# Patient Record
Sex: Female | Born: 1990 | Race: White | Hispanic: No | Marital: Single | Smoking: Never smoker
Health system: Southern US, Community
[De-identification: ages and names within clinical notes are randomized; demographics above are authoritative.]

## PROBLEM LIST (undated history)

## (undated) DIAGNOSIS — G809 Cerebral palsy, unspecified: Secondary | ICD-10-CM

## (undated) DIAGNOSIS — R6250 Unspecified lack of expected normal physiological development in childhood: Secondary | ICD-10-CM

## (undated) DIAGNOSIS — M419 Scoliosis, unspecified: Secondary | ICD-10-CM

## (undated) DIAGNOSIS — R569 Unspecified convulsions: Secondary | ICD-10-CM

## (undated) HISTORY — PX: OTHER SURGICAL HISTORY: SHX169

## (undated) HISTORY — DX: Scoliosis, unspecified: M41.9

## (undated) HISTORY — DX: Cerebral palsy, unspecified: G80.9

## (undated) HISTORY — DX: Unspecified lack of expected normal physiological development in childhood: R62.50

## (undated) HISTORY — DX: Unspecified convulsions: R56.9

## (undated) HISTORY — PX: HIP SURGERY: SHX245

## (undated) HISTORY — PX: GASTROSTOMY TUBE CHANGE: SHX312

---
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
## 2010-05-31 ENCOUNTER — Other Ambulatory Visit: Payer: Self-pay | Admitting: Pediatrics

## 2010-06-08 ENCOUNTER — Ambulatory Visit: Payer: Self-pay | Admitting: Pediatrics

## 2010-10-31 ENCOUNTER — Encounter: Payer: Self-pay | Admitting: Pediatrics

## 2011-08-19 ENCOUNTER — Encounter: Payer: Self-pay | Admitting: Pediatrics

## 2012-01-02 IMAGING — CR RIGHT HIP - COMPLETE 2+ VIEW
1 series · 2 of 2 positions shown · non-contrast
Comparison: none

REASON FOR EXAM: rt hip and leg pain
COMMENTS:

PROCEDURE:     DXR - DXR HIP RIGHT COMPLETE  - June 08, 2010 [DATE]
RESULT:     Comparison: None.

[Series 1: view not recorded · 0.17mm/px · 2 of 2 slices shown]
[im 1/2]
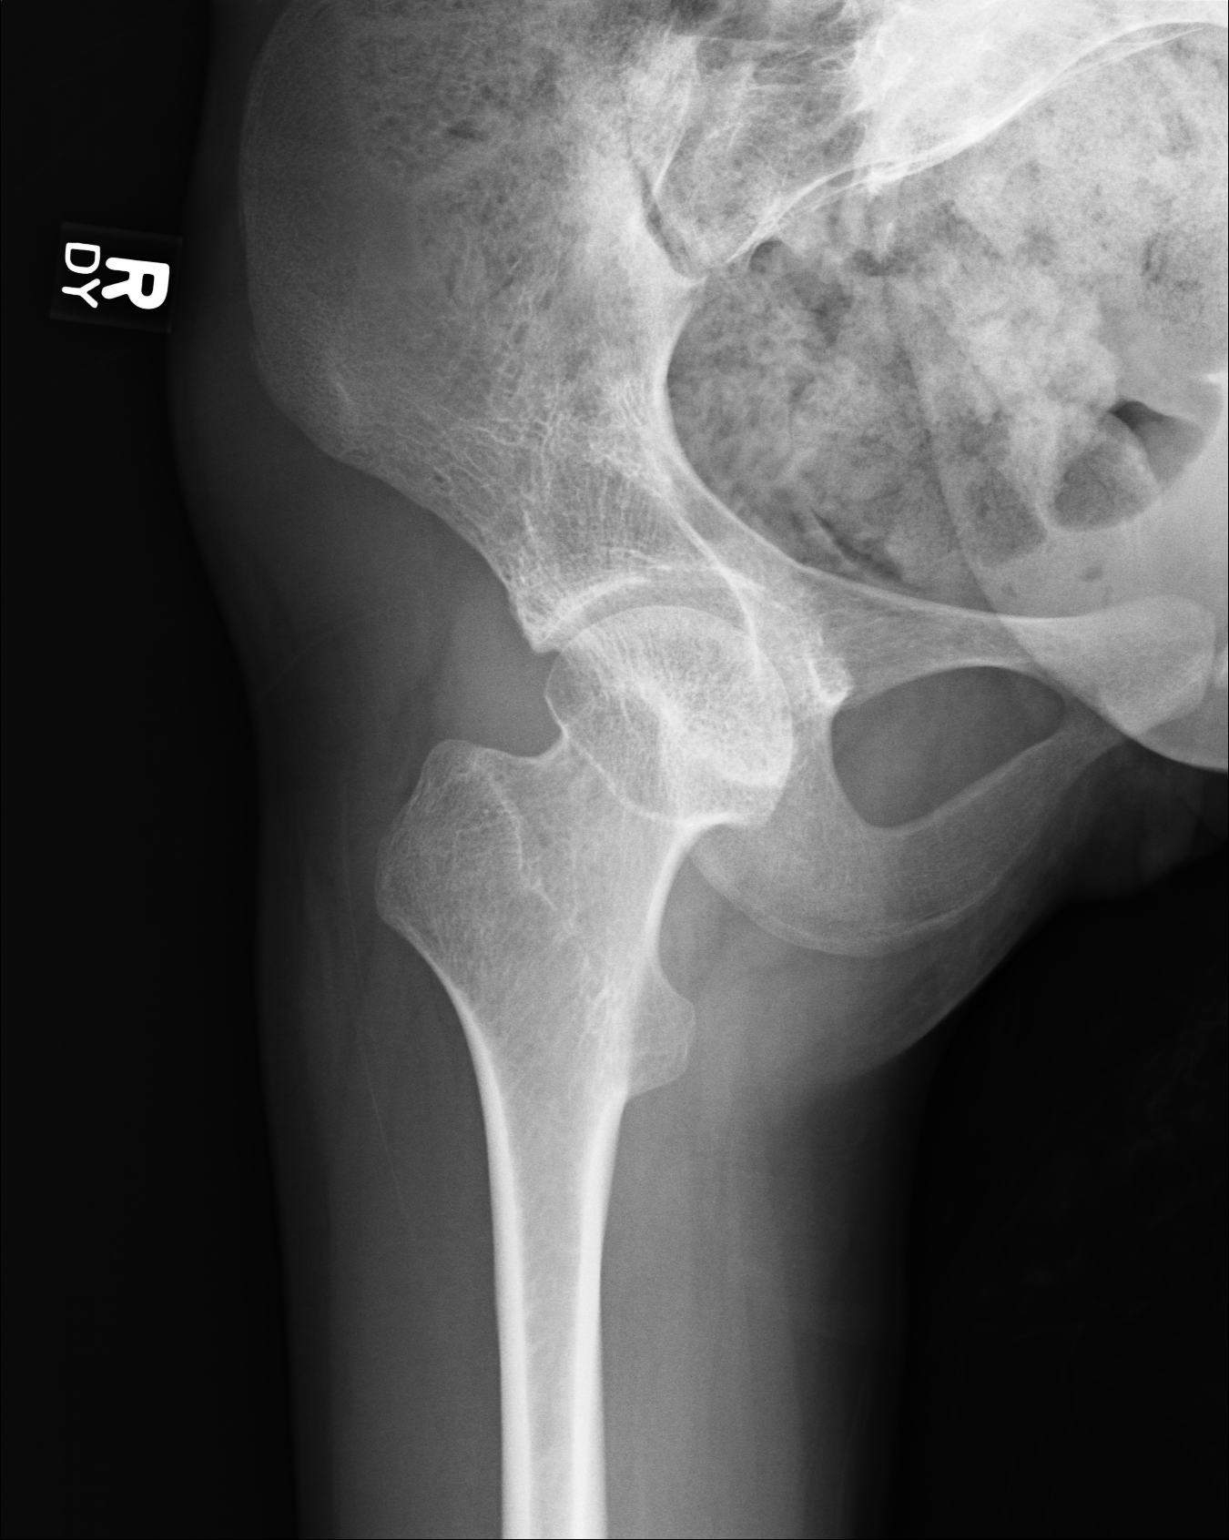
[im 2/2]
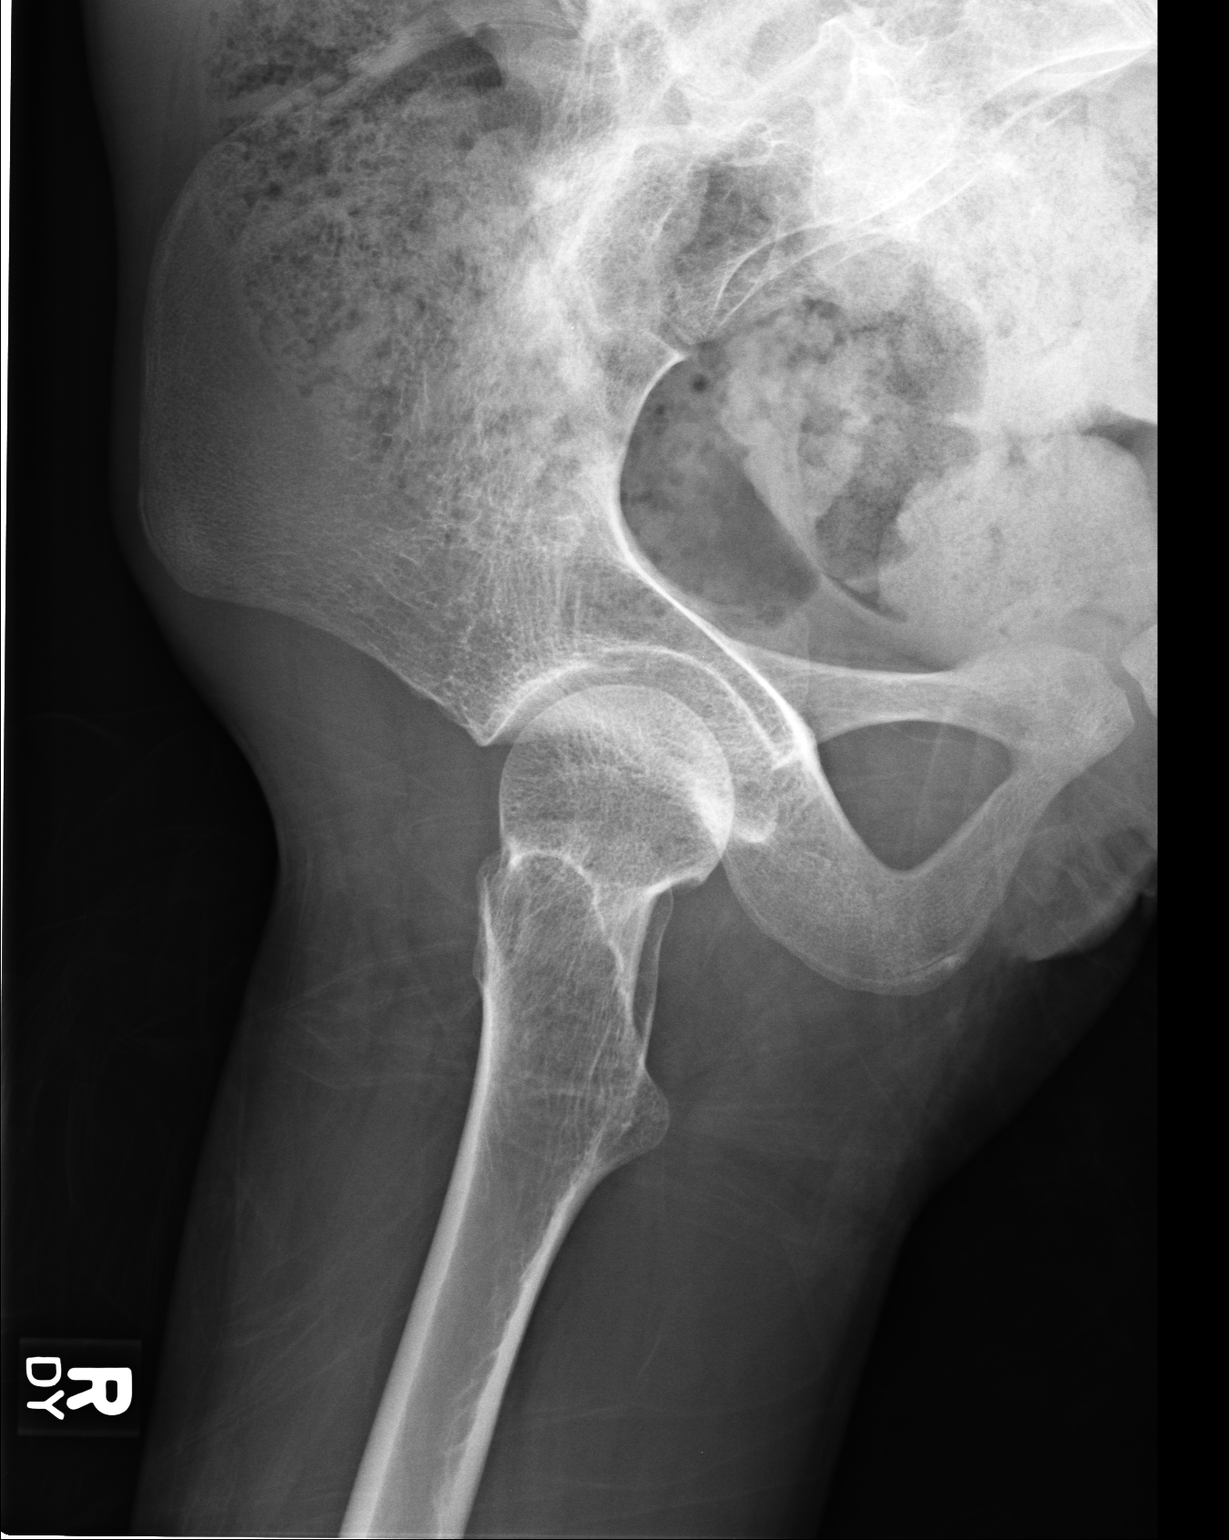

[2 of 2 positions shown; findings below may reference images not displayed]

FINDINGS: Evaluation is limited by patient positioning. The pelvis is tilted. No acute
fracture seen. There is unusual appearance of the right femoral head which
raises the possibility of some degree of congenital dysplasia of the femoral
head. The femoral diaphysis is relatively narrow, likely on a congenital
basis. If there is continued clinical concern, consider repeat radiograph
with improved attention to positioning and comparison with the left hip.
IMPRESSION: Please see above.

## 2012-10-15 ENCOUNTER — Other Ambulatory Visit: Payer: Self-pay | Admitting: Pediatrics

## 2012-10-15 LAB — COMPREHENSIVE METABOLIC PANEL
Albumin: 4.1 g/dL (ref 3.4–5.0)
Alkaline Phosphatase: 83 U/L (ref 50–136)
Anion Gap: 3 — ABNORMAL LOW (ref 7–16)
Bilirubin,Total: 0.2 mg/dL (ref 0.2–1.0)
Calcium, Total: 9.1 mg/dL (ref 8.5–10.1)
Co2: 30 mmol/L (ref 21–32)
EGFR (African American): >60
Osmolality: 279 (ref 275–301)
Potassium: 4.1 mmol/L (ref 3.5–5.1)
SGPT (ALT): 19 U/L (ref 12–78)
Sodium: 140 mmol/L (ref 136–145)

## 2012-10-15 LAB — CBC WITH DIFFERENTIAL/PLATELET
Basophil #: 0 10*3/uL (ref 0.0–0.1)
Eosinophil #: 0.1 10*3/uL (ref 0.0–0.7)
Eosinophil %: 1.8 %
HCT: 45.3 % (ref 35.0–47.0)
HGB: 15.5 g/dL (ref 12.0–16.0)
Lymphocyte %: 47.5 %
MCH: 31.5 pg (ref 26.0–34.0)
MCHC: 34.2 g/dL (ref 32.0–36.0)
Neutrophil #: 2 10*3/uL (ref 1.4–6.5)
Neutrophil %: 43 %
Platelet: 209 10*3/uL (ref 150–440)
RBC: 4.91 10*6/uL (ref 3.80–5.20)
RDW: 13.1 % (ref 11.5–14.5)
WBC: 4.7 10*3/uL (ref 3.6–11.0)

## 2015-07-12 ENCOUNTER — Other Ambulatory Visit
Admission: RE | Admit: 2015-07-12 | Discharge: 2015-07-12 | Disposition: A | Discharge status: Home / Self Care | Payer: Medicaid Other | Source: Ambulatory Visit | Attending: Pediatrics | Admitting: Pediatrics

## 2015-07-12 DIAGNOSIS — G809 Cerebral palsy, unspecified: Secondary | ICD-10-CM | POA: Insufficient documentation

## 2015-07-12 LAB — CBC WITH DIFFERENTIAL/PLATELET
BASOS ABS: 0 10*3/uL (ref 0–0.1)
Basophils Relative: 1 %
EOS ABS: 0.1 10*3/uL (ref 0–0.7)
Eosinophils Relative: 3 %
HCT: 45.2 % (ref 35.0–47.0)
Hemoglobin: 15.3 g/dL (ref 12.0–16.0)
LYMPHS ABS: 1.2 10*3/uL (ref 1.0–3.6)
Lymphocytes Relative: 28 %
MCH: 30.2 pg (ref 26.0–34.0)
MCHC: 33.8 g/dL (ref 32.0–36.0)
MCV: 89.4 fL (ref 80.0–100.0)
Monocytes Absolute: 0.3 10*3/uL (ref 0.2–0.9)
Monocytes Relative: 8 %
NEUTROS PCT: 60 %
Neutro Abs: 2.6 10*3/uL (ref 1.4–6.5)
PLATELETS: 273 10*3/uL (ref 150–440)
RBC: 5.06 MIL/uL (ref 3.80–5.20)
RDW: 13.5 % (ref 11.5–14.5)
WBC: 4.2 10*3/uL (ref 3.6–11.0)

## 2015-07-12 LAB — CARBAMAZEPINE LEVEL, TOTAL: CARBAMAZEPINE LVL: 7.1 ug/mL (ref 4.0–12.0)

## 2016-01-11 ENCOUNTER — Ambulatory Visit: Payer: Self-pay | Admitting: Family Medicine

## 2016-09-05 ENCOUNTER — Encounter: Payer: Self-pay | Admitting: Family Medicine

## 2016-09-05 ENCOUNTER — Ambulatory Visit (INDEPENDENT_AMBULATORY_CARE_PROVIDER_SITE_OTHER): Payer: Medicaid Other | Admitting: Family Medicine

## 2016-09-05 VITALS — BP 139/99 | HR 87 | Temp 98.4°F

## 2016-09-05 DIAGNOSIS — Z931 Gastrostomy status: Secondary | ICD-10-CM

## 2016-09-05 DIAGNOSIS — Z7689 Persons encountering health services in other specified circumstances: Secondary | ICD-10-CM

## 2016-09-05 DIAGNOSIS — G809 Cerebral palsy, unspecified: Secondary | ICD-10-CM | POA: Insufficient documentation

## 2016-09-05 DIAGNOSIS — G40909 Epilepsy, unspecified, not intractable, without status epilepticus: Secondary | ICD-10-CM | POA: Diagnosis not present

## 2016-09-05 NOTE — Patient Instructions (Signed)
Follow up in 3 months for Physical Exam

## 2016-09-05 NOTE — Progress Notes (Signed)
   BP (!) 139/99   Pulse 87   Temp 98.4 F (36.9 C)   LMP 08/11/2016 (Approximate)    Subjective:    Patient ID: Charlene Gomez, female    DOB: 1990/06/12, 26 y.o.   MRN: [id number]  HPI: Charlene BeltsKristin N Gomez is a 26 y.o. female  Chief Complaint  Patient presents with  . Establish Care    no concerns, she had been going to [facility].    Patient presents today with mother and grandmother who provide all of the hx. 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 Has been followed long term by [facility] and has aged out. Has a G tube for nutrition, has been followed by GI for this and needs a referral for a new one. Taking valium and carbatrol for seizure disorder, per family this has been stable on these medications. Has not seen Neurology for years. Also has a complicated orthopedic hx with multiple joint replacements, family wanting referral to discuss further intervention.   No other concerns at this time.   Relevant past medical, surgical, family and social history reviewed and updated as indicated. Interim medical history since our last visit reviewed. Allergies and medications reviewed and updated.  Review of Systems  Unable to perform ROS: Patient nonverbal   Per HPI unless specifically indicated above     Objective:    BP (!) 139/99   Pulse 87   Temp 98.4 F (36.9 C)   LMP 08/11/2016 (Approximate)   Wt Readings from Last 3 Encounters:  No data found for Wt    Physical Exam  Pulmonary/Chest: Effort normal. No respiratory distress.  Musculoskeletal:  Wheelchair bound, obvious skeletal abnormalities present from CP  Skin: Skin is warm and dry.  Psychiatric:  Pt non-verbal d/t CP  Nursing note and vitals reviewed.     Assessment & Plan:   Problem List Items Addressed This Visit      Nervous and Auditory   CP (cerebral palsy) (HCC)    Will hold off on referral to orthopedics until family decides where they are wanting the referral to be  sent. They will call back ASAP with this information.       Seizure disorder [facility](HCC)    Discussed with patient's family that I do not manage seizure medications and that I'm happy to refer them to a specialist. They prefer to have all medications handled with PCP. They will let us know what they decide to do here.         Other   Gastrostomy tube dependent (HCC)    Will refer to GI for management once family decides where they want her to go. They will call back with this information ASAP       Other Visit Diagnoses    Encounter to establish care    -  Primary       Follow up plan: Return in about 3 months (around 12/06/2016) for CPE.

## 2016-09-11 DIAGNOSIS — Z931 Gastrostomy status: Secondary | ICD-10-CM | POA: Insufficient documentation

## 2016-09-11 DIAGNOSIS — R569 Unspecified convulsions: Secondary | ICD-10-CM | POA: Insufficient documentation

## 2016-09-11 NOTE — Assessment & Plan Note (Signed)
Will refer to GI for management once family decides where they want her to go. They will call back with this information ASAP

## 2016-09-11 NOTE — Assessment & Plan Note (Signed)
Discussed with patient's family that I do not manage seizure medications and that I'm happy to refer them to a specialist. They prefer to have all medications handled with PCP. They will let us know what they decide to do here.

## 2016-09-11 NOTE — Assessment & Plan Note (Signed)
Will hold off on referral to orthopedics until family decides where they are wanting the referral to be sent. They will call back ASAP with this information.

## 2016-10-28 ENCOUNTER — Other Ambulatory Visit: Payer: Self-pay | Admitting: Family Medicine

## 2016-10-28 ENCOUNTER — Telehealth: Payer: Self-pay | Admitting: Family Medicine

## 2016-10-28 NOTE — Telephone Encounter (Signed)
Spoke with pharmacy, medication is ready to be picked up.

## 2016-10-28 NOTE — Telephone Encounter (Signed)
Patient's mother notified that medication is ready for pick up.

## 2016-10-28 NOTE — Telephone Encounter (Signed)
Patients mom called to see if someone had called in refills for the patients medication.  She has been out of her seizure meds since yesterday and had a seizure earlier today.    [facility]   [place]rie [place] [phone]  Thanks

## 2016-12-09 ENCOUNTER — Encounter: Payer: Medicaid Other | Admitting: Family Medicine

## 2016-12-23 ENCOUNTER — Encounter: Payer: Self-pay | Admitting: Family Medicine

## 2016-12-23 ENCOUNTER — Ambulatory Visit (INDEPENDENT_AMBULATORY_CARE_PROVIDER_SITE_OTHER): Payer: Medicaid Other | Admitting: Family Medicine

## 2016-12-23 VITALS — BP 131/86 | HR 85 | Temp 99.5°F

## 2016-12-23 DIAGNOSIS — B372 Candidiasis of skin and nail: Secondary | ICD-10-CM | POA: Diagnosis not present

## 2016-12-23 DIAGNOSIS — Z0000 Encounter for general adult medical examination without abnormal findings: Secondary | ICD-10-CM | POA: Diagnosis not present

## 2016-12-23 DIAGNOSIS — G40909 Epilepsy, unspecified, not intractable, without status epilepticus: Secondary | ICD-10-CM

## 2016-12-23 DIAGNOSIS — Z931 Gastrostomy status: Secondary | ICD-10-CM | POA: Diagnosis not present

## 2016-12-23 DIAGNOSIS — G809 Cerebral palsy, unspecified: Secondary | ICD-10-CM | POA: Diagnosis not present

## 2016-12-23 MED ORDER — NYSTATIN 100000 UNIT/GM EX CREA: 1.0000 "application " | TOPICAL_CREAM | Freq: Two times a day (BID) | CUTANEOUS | 2 refills | Status: DC

## 2016-12-23 NOTE — Progress Notes (Signed)
BP 131/86   Pulse 85   Temp 99.5 F (37.5 C)    Subjective:    Patient ID: Charlene Gomez, female    DOB: 01/17/1991, 26 y.o.   MRN: [id number]  HPI: Charlene Gomez is a 26 y.o. female presenting on 12/23/2016 for comprehensive medical examination. Current medical complaints include:new rash noted this morning in skin fold under ribcage  Also has not established with Neurology for seizures or CP, and does not have GI specialist to manage her G tube.   Depression Screen done today and results listed below:  No flowsheet data found.  The patient does not have a history of falls. I did not complete a risk assessment for falls. A plan of care for falls was not documented. *Patient is wheelchair bound*   Past Medical History:  Past Medical History:  Diagnosis Date  . CP (cerebral palsy) (HCC)   . Developmental delay   . Premature birth   . Scoliosis   . Seizures [facility])     Surgical History:  Past Surgical History:  Procedure Laterality Date  . GASTROSTOMY TUBE CHANGE    . heart murmur repair    . HIP SURGERY Left    2 surgeries, metal rod  . reflux surgery      Medications:  Current Outpatient Prescriptions on File Prior to Visit  Medication Sig  . carbamazepine (CARBATROL) 200 MG 12 hr capsule Take 200 mg by mouth 2 (two) times daily.  . diazepam (DIASTAT) 2.5 MG GEL Place 2.5 mg rectally once.   . diazepam (VALIUM) 5 MG tablet Take 5 mg by mouth every 6 (six) hours as needed for anxiety.  . ranitidine (ZANTAC) 75 MG/5ML syrup TAKE 5 ML BY MOUTH TWICE A DAY FOR 30 DAYS (5 ML = 75 MG)   No current facility-administered medications on file prior to visit.     Allergies:  Allergies  Allergen Reactions  . Cefprozil Other (See Comments)    Social History:  Social History   Social History  . Marital status: Single    Spouse name: N/A  . Number of children: N/A  . Years of education: N/A   Occupational History  . Not on file.   Social History Main  Topics  . Smoking status: Never Smoker  . Smokeless tobacco: Never Used  . Alcohol use No  . Drug use: No  . Sexual activity: Not on file   Other Topics Concern  . Not on file   Social History Narrative  . No narrative on file   History  Smoking Status  . Never Smoker  Smokeless Tobacco  . Never Used   History  Alcohol Use No    Family History:  Family History  Problem Relation Age of Onset  . COPD Mother   . Diabetes Maternal Grandmother   . Heart disease Maternal Grandmother   . Cancer Neg Hx   . Hypertension Neg Hx   . Stroke Neg Hx     Past medical history, surgical history, medications, allergies, family history and social history reviewed with patient today and changes made to appropriate areas of the chart.   Review of Systems - unable to perform ROS as pt is non-verbal, but primary caregiver states no concerns other than a new rash All other ROS negative except what is listed above and in the HPI.      Objective:    BP 131/86   Pulse 85   Temp 99.5 F (37.5 C)  Wt Readings from Last 3 Encounters:  No data found for Wt    Physical Exam  Results for orders placed or performed during the hospital encounter of 07/12/15  CBC with Differential/Platelet  Result Value Ref Range   WBC 4.2 3.6 - 11.0 K/uL   RBC 5.06 3.80 - 5.20 MIL/uL   Hemoglobin 15.3 12.0 - 16.0 g/dL   HCT 93.2 35.5 - 73.2 %   MCV 89.4 80.0 - 100.0 fL   MCH 30.2 26.0 - 34.0 pg   MCHC 33.8 32.0 - 36.0 g/dL   RDW 20.2 54.2 - 70.6 %   Platelets 273 150 - 440 K/uL   Neutrophils Relative % 60 %   Neutro Abs 2.6 1.4 - 6.5 K/uL   Lymphocytes Relative 28 %   Lymphs Abs 1.2 1.0 - 3.6 K/uL   Monocytes Relative 8 %   Monocytes Absolute 0.3 0.2 - 0.9 K/uL   Eosinophils Relative 3 %   Eosinophils Absolute 0.1 0 - 0.7 K/uL   Basophils Relative 1 %   Basophils Absolute 0.0 0 - 0.1 K/uL  Carbamazepine level, total  Result Value Ref Range   Carbamazepine Lvl 7.1 4.0 - 12.0 ug/mL        Assessment & Plan:   Problem List Items Addressed This Visit      Nervous and Auditory   CP (cerebral palsy) (HCC)    Wheelchair bound. Referral placed to multiple specialists for management      Seizure disorder [facility])    Referral placed to Neurology for management. Continue current regimen.       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 Recommended powders, regular cleaning   Relevant Medications   nystatin cream (MYCOSTATIN)       Follow up plan: Return in about 1 year (around 12/23/2017) for CPE.   LABORATORY TESTING:  - Pap smear: declined  IMMUNIZATIONS:   - Tdap: Tetanus vaccination status reviewed: postponed. - Influenza: Postponed to flu season  PATIENT COUNSELING:   Advised to take 1 mg of folate supplement per day if capable of pregnancy.   Sexuality: Discussed sexually transmitted diseases, partner selection, use of condoms, avoidance of unintended pregnancy  and contraceptive alternatives.   Advised to avoid cigarette smoking.  I discussed with the patient that most people either abstain from alcohol or drink within safe limits (<=14/week and <=4 drinks/occasion for males, <=7/weeks and <= 3 drinks/occasion for females) and that the risk for alcohol disorders and other health effects rises proportionally with the number of drinks per week and how often a drinker exceeds daily limits.  Discussed cessation/primary prevention of drug use and availability of treatment for abuse.   Diet: Encouraged to adjust caloric intake to maintain  or achieve ideal  body weight, to reduce intake of dietary saturated fat and total fat, to limit sodium intake by avoiding high sodium foods and not adding table salt, and to maintain adequate dietary potassium and calcium preferably from fresh fruits, vegetables,  and low-fat dairy products.    stressed the importance of regular exercise  Injury prevention: Discussed safety Gomez, safety helmets, smoke detector, smoking near bedding or upholstery.   Dental health: Discussed importance of regular tooth brushing, flossing, and dental visits.    NEXT PREVENTATIVE PHYSICAL DUE IN 1 YEAR. Return in about 1 year (around 12/23/2017) for CPE.

## 2016-12-25 NOTE — Assessment & Plan Note (Signed)
Wheelchair bound. Referral placed to multiple specialists for management

## 2016-12-25 NOTE — Assessment & Plan Note (Signed)
Referral placed to Neurology for management. Continue current regimen.

## 2016-12-25 NOTE — Patient Instructions (Signed)
Follow up in 1 year.

## 2016-12-25 NOTE — Assessment & Plan Note (Signed)
Referral to GI placed for management

## 2016-12-31 ENCOUNTER — Telehealth: Payer: Self-pay | Admitting: Family Medicine

## 2016-12-31 ENCOUNTER — Ambulatory Visit: Payer: Self-pay | Admitting: Gastroenterology

## 2016-12-31 NOTE — Telephone Encounter (Signed)
Patient's grandmother called to get neurology referral information. Patient's grandmother called to cancel her 10 am appointment. Patient's grandmother and patient would like to be refereed to Neurology such as [facility] [place]. Patient's grandmother would like to speak with someone about change in location for referral.  Patient's grandmother listed in [place].  Please Advise.  Thank you

## 2017-01-02 NOTE — Telephone Encounter (Signed)
See Referral, it is being changed.

## 2017-01-28 ENCOUNTER — Encounter: Payer: Self-pay | Admitting: Gastroenterology

## 2017-01-28 ENCOUNTER — Ambulatory Visit (INDEPENDENT_AMBULATORY_CARE_PROVIDER_SITE_OTHER): Payer: Medicaid Other | Admitting: Gastroenterology

## 2017-01-28 ENCOUNTER — Encounter (INDEPENDENT_AMBULATORY_CARE_PROVIDER_SITE_OTHER): Payer: Self-pay

## 2017-01-28 VITALS — HR 77

## 2017-01-28 DIAGNOSIS — L089 Local infection of the skin and subcutaneous tissue, unspecified: Secondary | ICD-10-CM | POA: Diagnosis not present

## 2017-01-28 DIAGNOSIS — Z931 Gastrostomy status: Secondary | ICD-10-CM

## 2017-01-28 DIAGNOSIS — K9422 Gastrostomy infection: Secondary | ICD-10-CM | POA: Diagnosis not present

## 2017-01-28 MED ORDER — MUPIROCIN CALCIUM 2 % EX CREA: 1.0000 "application " | TOPICAL_CREAM | Freq: Two times a day (BID) | CUTANEOUS | 0 refills | Status: AC

## 2017-01-28 NOTE — Progress Notes (Signed)
Wyline Mood MD, MRCP(U.K) [address]  [address]  [place], [place] [address]  Main: [phone]  Fax: [phone]   Gastroenterology Consultation  Referring Provider:     Particia Nearing,* Primary Care Physician:  Particia Nearing, [place] Primary Gastroenterologist:  Dr. Wyline Mood  Reason for Consultation:     G tube management         HPI:   Charlene Gomez is a 26 y.o. y/o female referred for consultation & management  by Dr. Maurice March, Salley Hews, PA-C.    She has been referred to see me for management of the G tube. Had a G tube since 26 years. Last changed 3 months back and they usually change it every 3 months. She wanted to establish care with a GI doctor. At present only issue is mild erythema at the PEG site.  Past Medical History:  Diagnosis Date  . CP (cerebral palsy) (HCC)   . Developmental delay   . Premature birth   . Scoliosis   . Seizures (HCC)     Past Surgical History:  Procedure Laterality Date  . GASTROSTOMY TUBE CHANGE    . heart murmur repair    . HIP SURGERY Left    2 surgeries, metal rod  . reflux surgery      Prior to Admission medications   Medication Sig Start Date End Date Taking? Authorizing Provider  carbamazepine (CARBATROL) 200 MG 12 hr capsule Take 200 mg by mouth 2 (two) times daily. 08/28/16   [provider]  diazepam (DIASTAT) 2.5 MG GEL Place 2.5 mg rectally once.     [provider]  diazepam (VALIUM) 5 MG tablet Take 5 mg by mouth every 6 (six) hours as needed for anxiety.    [provider]  nystatin cream (MYCOSTATIN) Apply 1 application topically 2 (two) times daily. 12/23/16   Particia Nearing, PA-C  ranitidine (ZANTAC) 75 MG/5ML syrup TAKE 5 ML BY MOUTH TWICE A DAY FOR 30 DAYS (5 ML = 75 MG) 07/29/16   [provider]    Family History  Problem Relation Age of Onset  . COPD Mother   . Diabetes Maternal Grandmother   . Heart disease Maternal Grandmother   .  Cancer Neg Hx   . Hypertension Neg Hx   . Stroke Neg Hx      Social History  Substance Use Topics  . Smoking status: Never Smoker  . Smokeless tobacco: Never Used  . Alcohol use No    Allergies as of 01/28/2017 - Review Complete 12/23/2016  Allergen Reaction Noted  . Cefprozil Other (See Comments)     Review of Systems:    All systems reviewed and negative except where noted in HPI.   Physical Exam:  There were no vitals taken for this visit. No LMP recorded. Psych:  Alert unable to communicate verbally . General:   Alert,  In wheel chair, not in distress  Head:  Normocephalic and atraumatic. Eyes:  Sclera clear, no icterus.   Conjunctiva pink. Ears:  Normal auditory acuity. Nose:  No deformity, discharge, or lesions. Mouth:  Poor dentition  Neck:  Supple; no masses or thyromegaly. Lungs:  Respirations even and unlabored.  Clear throughout to auscultation.   No wheezes, crackles, or rhonchi. No acute distress. Heart:  Regular rate and rhythm; no murmurs, clicks, rubs, or gallops. Abdomen:  Peg site appears mildly erthematous, PEG tube freely mobile , no fluctuation or tenderness Msk:  Symmetrical without gross  deformities. Good, equal movement & strength bilaterally. Pulses:  Normal pulses noted.  Imaging Studies: No results found.  Assessment and Plan:   Charlene Gomez is a 26 y.o. y/o female has been referred for G tube management . The external gastrostomy tube bolster should be positioned such that 1 to 2 cm of in and out movement can be achieved. Loose apposition of the bolster to the abdominal wall does not result in peritoneal leakage .If the tissue between the internal and external bolsters is compressed, it may lead to pressure necrosis and breakdown of the gastrostomy site.Flushes of 20 mL of water should be performed after the administration of medications or tube feeds to decrease the risk of clogging.When cleaning the site, the gastrostomy tube should be  inspected for signs of tube deterioration. If these develop, a replacement tube will be needed.Gauze pads should be placed over the external bolster, not underneath, which would create pressure on the gastrostomy tube tract.  Today I will provide her a script for topical mupirocin for the wound site to treat any local infection.   A print out of this office visit will be provided to the patient .   Follow up as needed   Dr Wyline Mood MD,MRCP(U.K)

## 2017-03-30 ENCOUNTER — Telehealth: Payer: Self-pay | Admitting: Family Medicine

## 2017-03-30 NOTE — Telephone Encounter (Signed)
I did receive the paperwork - I am unclear if there is anything necessary for me to complete as it states the form is "for review" and the PT working with her had already filled it out. As far as concerns, she is mostly cared for by her specialists and is very new to me. I do not have any particular concerns at this time but it may be good for them to reach out to her specialists for further details.   Copied from CRM [id number]. Topic: Inquiry >> Mar 25, 2017  4:08 PM Landry MellowFoltz, Melissa J wrote: Reason for CRM: Charlene MuldersBrianna from [place] [facility] called to speak with md or cma to find out if they have any concerns about pt. She also wanted to confirm fax that was sent over on  03/11/17 was received.  Cb number is [phone]

## 2017-03-30 NOTE — Telephone Encounter (Signed)
Left message for Charlene MuldersBrianna to return call.

## 2017-04-01 NOTE — Telephone Encounter (Signed)
Spoke with [name], message was relayed. Denied questions.

## 2017-05-07 ENCOUNTER — Telehealth: Payer: Self-pay

## 2017-05-07 DIAGNOSIS — G40909 Epilepsy, unspecified, not intractable, without status epilepticus: Secondary | ICD-10-CM

## 2017-05-07 NOTE — Telephone Encounter (Signed)
Copied from CRM [phone]. Topic: Referral - Question >> May 07, 2017 10:58 AM Diana EvesHoyt, Maryann B wrote: Reason for CRM: pts mom calling in regards to a referral that was placed on 12/23/16. Pt's mom has contacted Dr. Geralyn FlashPotters office and they are stating they dont have a referral for the pt. Please contact mom and let her know what she needs to do from here. Mom's cell is [phone]

## 2017-05-07 NOTE — Telephone Encounter (Signed)
Patient No Showed to her appointment with Dr. Malvin JohnsPotter. The referral may not be in the system because they will only hold on to it for 3-6 months. Mother notified.   Mother requested urgent referral to Dr. Malvin JohnsPotter.  Explained to mother if patient's seizures have worsened or loss of consciences to go the ER. Mother understood.

## 2017-05-07 NOTE — Telephone Encounter (Signed)
New referral placed. Please give Mother phone number and she can call to make the appointment to move things along. Thanks!

## 2017-06-08 ENCOUNTER — Telehealth: Payer: Self-pay | Admitting: Family Medicine

## 2017-06-08 DIAGNOSIS — M6249 Contracture of muscle, multiple sites: Secondary | ICD-10-CM | POA: Insufficient documentation

## 2017-06-08 NOTE — Telephone Encounter (Signed)
I've never run a carbamazepine level on her because I sent her directly to Neurology for management after establishing care. We have some old levels from her Pediatrician as recently as 2017 but nothing since

## 2017-06-08 NOTE — Telephone Encounter (Signed)
Routing to provider  

## 2017-06-08 NOTE — Telephone Encounter (Signed)
Copied from CRM [phone]. Topic: Inquiry >> Jun 08, 2017  1:20 PM [name], [name], [name] wrote: Reason for CRM: Cori [facility] called because they need copies of the patients carbamazepine labs. If someone could give her a call back about this at [phone] and fax number is [phone]

## 2017-06-08 NOTE — Telephone Encounter (Signed)
Tried calling and it stated office is closed. Will try again tomorrow.

## 2017-06-09 NOTE — Telephone Encounter (Signed)
[facility] notified. They said they will send the information back to the nurse.

## 2018-01-06 DIAGNOSIS — M4146 Neuromuscular scoliosis, lumbar region: Secondary | ICD-10-CM | POA: Insufficient documentation

## 2018-04-14 ENCOUNTER — Other Ambulatory Visit: Payer: Self-pay | Admitting: Family Medicine

## 2018-04-14 NOTE — Telephone Encounter (Signed)
Pt's mother called to see if there was any way for Rx to be filled as soon as possible. Pt has a raw area where the cream goes and needs it as soon as possible. Please advise CB# [phone]

## 2018-04-14 NOTE — Telephone Encounter (Signed)
Requested medication (s) are due for refill today: Yes  Requested medication (s) are on the active medication list: Yes  Last refill:  12/23/16  Future visit scheduled: Yes  Notes to clinic:  Unable to refill per protocol.     Requested Prescriptions  Pending Prescriptions Disp Refills   nystatin cream (MYCOSTATIN) [Pharmacy Med Name: NYSTATIN 100,000 UNIT/GM CREAM] 30 g 2    Sig: APPLY TO AFFECTED AREA TWICE A DAY     Off-Protocol Failed - 04/14/2018  3:48 PM      Failed - Medication not assigned to a protocol, review manually.      Failed - Valid encounter within last 12 months    Recent Outpatient Visits          1 year ago Annual physical exam   [facility] Particia NearingLane, Rachel Elizabeth, [place]   1 year ago Encounter to establish care   [facility] Lane, Salley Hewsachel Elizabeth, [place]      Future Appointments            In 3 weeks Maurice MarchLane, Salley Hewsachel Elizabeth, PA-C [facility]

## 2018-05-11 ENCOUNTER — Encounter: Payer: Self-pay | Admitting: Family Medicine

## 2018-06-09 ENCOUNTER — Encounter: Payer: Self-pay | Admitting: Family Medicine

## 2018-06-09 ENCOUNTER — Ambulatory Visit (INDEPENDENT_AMBULATORY_CARE_PROVIDER_SITE_OTHER): Payer: Medicaid Other | Admitting: Family Medicine

## 2018-06-09 VITALS — HR 90

## 2018-06-09 DIAGNOSIS — R569 Unspecified convulsions: Secondary | ICD-10-CM

## 2018-06-09 DIAGNOSIS — R21 Rash and other nonspecific skin eruption: Secondary | ICD-10-CM | POA: Diagnosis not present

## 2018-06-09 DIAGNOSIS — Z0000 Encounter for general adult medical examination without abnormal findings: Secondary | ICD-10-CM | POA: Diagnosis not present

## 2018-06-09 DIAGNOSIS — Z931 Gastrostomy status: Secondary | ICD-10-CM

## 2018-06-09 DIAGNOSIS — G809 Cerebral palsy, unspecified: Secondary | ICD-10-CM

## 2018-06-09 MED ORDER — MUPIROCIN 2 % EX OINT: 1.0000 "application " | TOPICAL_OINTMENT | Freq: Two times a day (BID) | CUTANEOUS | 0 refills | Status: DC

## 2018-06-09 MED ORDER — RANITIDINE HCL 75 MG/5ML PO SYRP: 75.0000 mg | ORAL_SOLUTION | Freq: Two times a day (BID) | ORAL | 3 refills | Status: DC

## 2018-06-09 NOTE — Patient Instructions (Signed)
Wyline Mood MD, MRCP(U.K) [address]  [address]  [place], [place] [address]  Main: [phone]  Fax: [phone]

## 2018-06-09 NOTE — Progress Notes (Signed)
Pulse 90   SpO2 96%    Subjective:    Patient ID: Charlene Gomez, female    DOB: 29-Aug-1990, 28 y.o.   MRN: [id number]  HPI: Charlene Gomez is a 28 y.o. female presenting on 06/09/2018 for comprehensive medical examination. Current medical complaints include:here with mother and grandmother today. see below for concerns  Mother states she's still having redness and irritation chronically around g tube. Using nystatin regularly. Denies thick drainage, fevers, chills, sweats. Tube managed by GI, but per record review they have not followed up in over a year. Takes zantac daily.   CP monitored by Ortho and Neuro. Neuro also manages her seizure disorder, which she is taking carbamazepine for. Wheelchair bound, has home health services in place.   No new concerns.   She currently lives with: Menopausal Symptoms: no  Depression Screen done today and results listed below:  No flowsheet data found.  The patient does not have a history of falls. I did not complete a risk assessment for falls. A plan of care for falls was not documented.   Past Medical History:  Past Medical History:  Diagnosis Date  . CP (cerebral palsy) (HCC)   . Developmental delay   . Premature birth   . Scoliosis   . Seizures [facility])     Surgical History:  Past Surgical History:  Procedure Laterality Date  . GASTROSTOMY TUBE CHANGE    . heart murmur repair    . HIP SURGERY Left    2 surgeries, metal rod  . reflux surgery      Medications:  Current Outpatient Medications on File Prior to Visit  Medication Sig  . carbamazepine (CARBATROL) 200 MG 12 hr capsule Take 200 mg by mouth 2 (two) times daily.  . diazepam (DIASTAT) 2.5 MG GEL Place 2.5 mg rectally once.   . diazepam (VALIUM) 5 MG tablet Take 5 mg by mouth every 6 (six) hours as needed for anxiety.  Marland Kitchen nystatin cream (MYCOSTATIN) Apply topically 2 (two) times daily as needed.   No current facility-administered medications on file prior to  visit.     Allergies:  Allergies  Allergen Reactions  . Cefprozil Other (See Comments)    Social History:  Social History   Socioeconomic History  . Marital status: Single    Spouse name: Not on file  . Number of children: Not on file  . Years of education: Not on file  . Highest education level: Not on file  Occupational History  . Not on file  Social Needs  . Financial resource strain: Not on file  . Food insecurity:    Worry: Not on file    Inability: Not on file  . Transportation needs:    Medical: Not on file    Non-medical: Not on file  Tobacco Use  . Smoking status: Never Smoker  . Smokeless tobacco: Never Used  Substance and Sexual Activity  . Alcohol use: No  . Drug use: No  . Sexual activity: Not on file  Lifestyle  . Physical activity:    Days per week: Not on file    Minutes per session: Not on file  . Stress: Not on file  Relationships  . Social connections:    Talks on phone: Not on file    Gets together: Not on file    Attends religious service: Not on file    Active member of club or organization: Not on file    Attends meetings of clubs  or organizations: Not on file    Relationship status: Not on file  . Intimate partner violence:    Fear of current or ex partner: Not on file    Emotionally abused: Not on file    Physically abused: Not on file    Forced sexual activity: Not on file  Other Topics Concern  . Not on file  Social History Narrative  . Not on file   Social History   Tobacco Use  Smoking Status Never Smoker  Smokeless Tobacco Never Used   Social History   Substance and Sexual Activity  Alcohol Use No    Family History:  Family History  Problem Relation Age of Onset  . COPD Mother   . Diabetes Maternal Grandmother   . Heart disease Maternal Grandmother   . Cancer Neg Hx   . Hypertension Neg Hx   . Stroke Neg Hx     Past medical history, surgical history, medications, allergies, family history and social history  reviewed with patient today and changes made to appropriate areas of the chart.   Review of Systems - unable to attain, pt non verbal All other ROS negative except what is listed above and in the HPI.      Objective:    Pulse 90   SpO2 96%   Wt Readings from Last 3 Encounters:  No data found for Wt    Physical Exam Vitals signs and nursing note reviewed.  HENT:     Right Ear: Tympanic membrane normal.     Left Ear: Tympanic membrane normal.     Nose: Nose normal.     Mouth/Throat:     Mouth: Mucous membranes are moist.  Eyes:     Conjunctiva/sclera: Conjunctivae normal.  Neck:     Comments: At baseline Cardiovascular:     Rate and Rhythm: Normal rate.  Pulmonary:     Effort: Pulmonary effort is normal.     Breath sounds: Normal breath sounds.  Abdominal:     Comments: g tube in place  Musculoskeletal:     Comments: At baseline  Skin:    General: Skin is warm and dry.     Findings: Rash (area of redness and irritation around g tube. no thick drainage or edema ) present.  Neurological:     Mental Status: She is alert. Mental status is at baseline.  Psychiatric:        Behavior: Behavior normal.     Results for orders placed or performed during the hospital encounter of 07/12/15  CBC with Differential/Platelet  Result Value Ref Range   WBC 4.2 3.6 - 11.0 K/uL   RBC 5.06 3.80 - 5.20 MIL/uL   Hemoglobin 15.3 12.0 - 16.0 g/dL   HCT 16.145.2 09.635.0 - 04.547.0 %   MCV 89.4 80.0 - 100.0 fL   MCH 30.2 26.0 - 34.0 pg   MCHC 33.8 32.0 - 36.0 g/dL   RDW 40.913.5 81.111.5 - 91.414.5 %   Platelets 273 150 - 440 K/uL   Neutrophils Relative % 60 %   Neutro Abs 2.6 1.4 - 6.5 K/uL   Lymphocytes Relative 28 %   Lymphs Abs 1.2 1.0 - 3.6 K/uL   Monocytes Relative 8 %   Monocytes Absolute 0.3 0.2 - 0.9 K/uL   Eosinophils Relative 3 %   Eosinophils Absolute 0.1 0 - 0.7 K/uL   Basophils Relative 1 %   Basophils Absolute 0.0 0 - 0.1 K/uL  Carbamazepine level, total  Result Value Ref Range  Carbamazepine Lvl 7.1 4.0 - 12.0 ug/mL      Assessment & Plan:   Problem List Items Addressed This Visit      Nervous and Auditory   CP (cerebral palsy) (HCC)    Continue following with specialists        Other   Seizure (HCC)    Followed by Neuro, continue per their recommendation      Gastrostomy tube dependent (HCC)    Managed by GI. Information given to caregivers, they will call GI and schedule f/u as soon as possible.        Other Visit Diagnoses    Annual physical exam    -  Primary   Rash       Will add bactroban given skin breakdown around g tube to reduce infection risks. Continue nystatin prn and keep clean and dry       Follow up plan: Return in about 1 year (around 06/10/2019) for CPE.   LABORATORY TESTING:  - Pap smear: deferred  IMMUNIZATIONS:   - Tdap: Tetanus vaccination status reviewed: family declines. - Influenza: Up to date  PATIENT COUNSELING:   Advised to take 1 mg of folate supplement per day if capable of pregnancy.   Sexuality: Discussed sexually transmitted diseases, partner selection, use of condoms, avoidance of unintended pregnancy  and contraceptive alternatives.   Advised to avoid cigarette smoking.  I discussed with the patient that most people either abstain from alcohol or drink within safe limits (<=14/week and <=4 drinks/occasion for males, <=7/weeks and <= 3 drinks/occasion for females) and that the risk for alcohol disorders and other health effects rises proportionally with the number of drinks per week and how often a drinker exceeds daily limits.  Discussed cessation/primary prevention of drug use and availability of treatment for abuse.   Diet: Encouraged to adjust caloric intake to maintain  or achieve ideal body weight, to reduce intake of dietary saturated fat and total fat, to limit sodium intake by avoiding high sodium foods and not adding table salt, and to maintain adequate dietary potassium and calcium preferably  from fresh fruits, vegetables, and low-fat dairy products.    stressed the importance of regular exercise  Injury prevention: Discussed safety Gomez, safety helmets, smoke detector, smoking near bedding or upholstery.   Dental health: Discussed importance of regular tooth brushing, flossing, and dental visits.    NEXT PREVENTATIVE PHYSICAL DUE IN 1 YEAR. Return in about 1 year (around 06/10/2019) for CPE.

## 2018-06-14 NOTE — Assessment & Plan Note (Addendum)
Managed by GI. Information given to caregivers, they will call GI and schedule f/u as soon as possible.

## 2018-06-14 NOTE — Assessment & Plan Note (Signed)
Followed by Neuro, continue per their recommendation

## 2018-06-14 NOTE — Assessment & Plan Note (Signed)
Continue following with specialists.  

## 2018-06-18 ENCOUNTER — Other Ambulatory Visit: Payer: Self-pay | Admitting: Family Medicine

## 2018-06-18 NOTE — Telephone Encounter (Signed)
Copied from CRM [id number]. Topic: Quick Communication - Rx Refill/Question >> Jun 18, 2018 10:32 AM Percival Spanish wrote: Medication   carbamazepine (CARBATROL) 200 MG 12 hr capsule  Has the patient contacted their pharmacy yes    [facility]  [address] [place]   Agent: Please be advised that RX refills may take up to 3 business days. We ask that you follow-up with your pharmacy.

## 2018-06-18 NOTE — Telephone Encounter (Signed)
This medicine is managed by Neurology, she will need to speak with them about it and is possibly due or overdue for a follow up with them

## 2018-06-18 NOTE — Telephone Encounter (Signed)
Called patient's mother. Stated what Fleet Contras stated. The mother said that her medication was filled and everything is fine. Still explained about the referrals and asked who filled her medication. Mother stated that she thought it was from [place], but she has the medication and everything is fine, goodbye. And the mother hung up.   Routing to provider.

## 2018-06-18 NOTE — Telephone Encounter (Signed)
Requested medication (s) are due for refill today: ?  Requested medication (s) are on the active medication list: yes  Last refill:  Prescription dated 5/3/183  Future visit scheduled: no  Notes to clinic: not delegated; historical provider    Requested Prescriptions  Pending Prescriptions Disp Refills   carbamazepine (CARBATROL) 200 MG 12 hr capsule  6    Sig: Take 1 capsule (200 mg total) by mouth 2 (two) times daily.     Not Delegated - Neurology:  Anticonvulsants - carbamazepine Failed - 06/18/2018 10:39 AM      Failed - This refill cannot be delegated      Failed - AST in normal range and within 90 days    SGOT(AST)  Date Value Ref Range Status  10/15/2012 18 15 - 37 Unit/L Final         Failed - ALT in normal range and within 90 days    SGPT (ALT)  Date Value Ref Range Status  10/15/2012 19 12 - 78 U/L Final         Failed - Carbamazepine (serum) in normal range and within 360 days    Carbamazepine Lvl  Date Value Ref Range Status  07/12/2015 7.1 4.0 - 12.0 ug/mL Final         Failed - WBC in normal range and within 90 days    WBC  Date Value Ref Range Status  07/12/2015 4.2 3.6 - 11.0 K/uL Final         Failed - PLT in normal range and within 90 days    Platelets  Date Value Ref Range Status  07/12/2015 273 150 - 440 K/uL Final   Platelet  Date Value Ref Range Status  10/15/2012 209 150 - 440 x10 3/mm 3 Final         Failed - HGB in normal range and within 90 days    Hemoglobin  Date Value Ref Range Status  07/12/2015 15.3 12.0 - 16.0 g/dL Final   HGB  Date Value Ref Range Status  10/15/2012 15.5 12.0 - 16.0 g/dL Final         Failed - Na in normal range and within 90 days    Sodium  Date Value Ref Range Status  10/15/2012 140 136 - 145 mmol/L Final         Failed - HCT in normal range and within 90 days    HCT  Date Value Ref Range Status  07/12/2015 45.2 35.0 - 47.0 % Final  10/15/2012 45.3 35.0 - 47.0 % Final         Passed -  Valid encounter within last 12 months    Recent Outpatient Visits          1 week ago Annual physical exam   [facility] Particia Nearing, PA-C   1 year ago Annual physical exam   [facility] Particia Nearing, [place]   1 year ago Encounter to establish care   [facility], [place], [place]

## 2018-06-18 NOTE — Telephone Encounter (Signed)
No, please let her mother know that I DO NOT write this medicine and she needs to go back to her Neurologist. I don't see where we have refilled it at this time so it must have come from somebody else but I need them to understand that I do not manage her Neurologic conditions. I also do not see that they've made an appt with Dr. Tobi Bastos with GI. She needs to be seen regularly with her specialists who manage her chronic conditions. She has only ever been seen once by GI and once by Neurology in the two years I have been her PCP and they are to be managing these conditions.

## 2018-06-18 NOTE — Telephone Encounter (Signed)
Pt's mother Lorina Rabon calling.  States she just spoke with someone (possibly Lupita Leash) who told her pt's carbamazepine (CARBATROL) 200 MG 12 hr capsule was being called in but pharmacy has nothing.

## 2018-06-18 NOTE — Telephone Encounter (Signed)
Tried calling patient's mother. No answer. Unable to leave message as the VM box was full. Will try later.

## 2018-06-18 NOTE — Telephone Encounter (Signed)
Called and spoke to patient. She stated that she got her medication refilled. Pt stated that the medicine is not managed by the Neurology but by Roosvelt Maser. Just FYI

## 2019-02-05 ENCOUNTER — Other Ambulatory Visit: Payer: Self-pay | Admitting: Family Medicine

## 2019-02-16 ENCOUNTER — Telehealth: Payer: Self-pay | Admitting: Family Medicine

## 2019-02-16 NOTE — Telephone Encounter (Signed)
[employer] , Apolonio Schneiders calling [phone]   Had sent an assessment for Medical equipment ie sling over for pt, not received, fax form back to [phone]  Office at lunch

## 2019-02-17 NOTE — Telephone Encounter (Signed)
Have not seen form. Called and spoke with Apolonio Schneiders, she is going to have her staff re-fax the form to [place].

## 2019-05-17 ENCOUNTER — Encounter: Payer: Self-pay | Admitting: Family Medicine

## 2019-05-17 ENCOUNTER — Other Ambulatory Visit: Payer: Self-pay

## 2019-05-17 ENCOUNTER — Other Ambulatory Visit: Payer: Self-pay | Admitting: Family Medicine

## 2019-05-17 ENCOUNTER — Ambulatory Visit (INDEPENDENT_AMBULATORY_CARE_PROVIDER_SITE_OTHER): Payer: Medicaid Other | Admitting: Family Medicine

## 2019-05-17 DIAGNOSIS — J3089 Other allergic rhinitis: Secondary | ICD-10-CM

## 2019-05-17 DIAGNOSIS — Z931 Gastrostomy status: Secondary | ICD-10-CM | POA: Diagnosis not present

## 2019-05-17 DIAGNOSIS — G809 Cerebral palsy, unspecified: Secondary | ICD-10-CM

## 2019-05-17 DIAGNOSIS — R569 Unspecified convulsions: Secondary | ICD-10-CM | POA: Diagnosis not present

## 2019-05-17 DIAGNOSIS — J309 Allergic rhinitis, unspecified: Secondary | ICD-10-CM | POA: Insufficient documentation

## 2019-05-17 MED ORDER — RANITIDINE HCL 75 MG/5ML PO SYRP: 75.0000 mg | ORAL_SOLUTION | Freq: Two times a day (BID) | ORAL | 3 refills | Status: DC

## 2019-05-17 MED ORDER — CETIRIZINE HCL 5 MG/5ML PO SOLN: 10.0000 mg | Freq: Every day | ORAL | 11 refills | Status: DC

## 2019-05-17 NOTE — Assessment & Plan Note (Signed)
Following with GI, continue per their recommendations 

## 2019-05-17 NOTE — Progress Notes (Signed)
There were no vitals taken for this visit.   Subjective:    Patient ID: Charlene Gomez, female    DOB: 1990/09/22, 29 y.o.   MRN: [id number]  HPI: Charlene Gomez is a 29 y.o. female  Chief Complaint  Patient presents with  . Follow-up    pt's mother stated patient needs a new weelchair    . This visit was completed via WebEx due to the restrictions of the COVID-19 pandemic. All issues as above were discussed and addressed. Physical exam was done as above through visual confirmation on WebEx. If it was felt that the patient should be evaluated in the office, they were directed there. The patient verbally consented to this visit. . Location of the patient: home . Location of the provider: home . Those involved with this call:  . Provider: Merrie Roof, PA-C . CMA: Lesle Chris, [place] . Front Desk/Registration: Jill Side  . Time spent on call: 15 minutes with patient face to face via video conference. More than 50% of this time was spent in counseling and coordination of care. 5 minutes total spent in review of patient's record and preparation of their chart. I verified patient identity using two factors (patient name and date of birth). Patient consents verbally to being seen via telemedicine visit today.   Patient presenting today with mother who is her guardian and full time caregiver. She provides all the history for today's visit. Main concern today is needing forms completed to obtain a new wheelchair as her old one is needing replacement per mother. Had papers faxed over to the clinic to be filled out.   Also out of her reflux and allergy medications, needing refill. Mother states everything is stable and doing well with her medications. No other concerns today. Still following with specialists for her CP, seizure disorder.   Relevant past medical, surgical, family and social history reviewed and updated as indicated. Interim medical history since our last visit  reviewed. Allergies and medications reviewed and updated.  Review of Systems  Per HPI unless specifically indicated above     Objective:    There were no vitals taken for this visit.  Wt Readings from Last 3 Encounters:  No data found for Wt    Physical Exam Vitals and nursing note reviewed.  Constitutional:      General: She is not in acute distress. HENT:     Head: Atraumatic.     Mouth/Throat:     Mouth: Mucous membranes are moist.     Pharynx: Oropharynx is clear.  Eyes:     Conjunctiva/sclera: Conjunctivae normal.  Cardiovascular:     Comments: Unable to assess via virtual visit Pulmonary:     Effort: Pulmonary effort is normal. No respiratory distress.  Musculoskeletal:     Cervical back: Neck rigidity: baseline.     Comments: In wheelchair  Skin:    General: Skin is dry.     Findings: No erythema.  Neurological:     Mental Status: She is alert. Mental status is at baseline.  Psychiatric:     Comments: Nonverbal, unable to assess     Results for orders placed or performed during the hospital encounter of 07/12/15  CBC with Differential/Platelet  Result Value Ref Range   WBC 4.2 3.6 - 11.0 K/uL   RBC 5.06 3.80 - 5.20 MIL/uL   Hemoglobin 15.3 12.0 - 16.0 g/dL   HCT 45.2 35.0 - 47.0 %   MCV 89.4 80.0 - 100.0 fL  MCH 30.2 26.0 - 34.0 pg   MCHC 33.8 32.0 - 36.0 g/dL   RDW 63.8 46.6 - 59.9 %   Platelets 273 150 - 440 K/uL   Neutrophils Relative % 60 %   Neutro Abs 2.6 1.4 - 6.5 K/uL   Lymphocytes Relative 28 %   Lymphs Abs 1.2 1.0 - 3.6 K/uL   Monocytes Relative 8 %   Monocytes Absolute 0.3 0.2 - 0.9 K/uL   Eosinophils Relative 3 %   Eosinophils Absolute 0.1 0 - 0.7 K/uL   Basophils Relative 1 %   Basophils Absolute 0.0 0 - 0.1 K/uL  Carbamazepine level, total  Result Value Ref Range   Carbamazepine Lvl 7.1 4.0 - 12.0 ug/mL      Assessment & Plan:   Problem List Items Addressed This Visit      Respiratory   Allergic rhinitis    Continue  allergy syrup daily        Nervous and Auditory   CP (cerebral palsy) (HCC) - Primary    Immobilized to wheelchair, needing new one. Will complete forms for this and submit. Continue following with specialists and home therapies        Other   Seizure [facility])    Following with Neurology, continue per their recommendations      Gastrostomy tube dependent [facility])    Following with GI, continue per their recommendations          Follow up plan: Return in about 1 year (around 05/16/2020) for CPE.

## 2019-05-17 NOTE — Assessment & Plan Note (Signed)
Following with Neurology, continue per their recommendations 

## 2019-05-17 NOTE — Assessment & Plan Note (Signed)
Immobilized to wheelchair, needing new one. Will complete forms for this and submit. Continue following with specialists and home therapies

## 2019-05-17 NOTE — Assessment & Plan Note (Signed)
Continue allergy syrup daily

## 2019-05-19 ENCOUNTER — Other Ambulatory Visit: Payer: Self-pay

## 2019-05-19 NOTE — Telephone Encounter (Signed)
Fax from pharmacy. Rx needs directions, refills and quantity.

## 2019-05-20 MED ORDER — FAMOTIDINE 40 MG/5ML PO SUSR: 40.0000 mg | Freq: Two times a day (BID) | ORAL | 11 refills | Status: DC | PRN

## 2019-05-20 NOTE — Telephone Encounter (Signed)
RX modified 

## 2019-06-23 ENCOUNTER — Other Ambulatory Visit: Payer: Self-pay | Admitting: Family Medicine

## 2019-07-08 ENCOUNTER — Telehealth: Payer: Self-pay | Admitting: Family Medicine

## 2019-07-08 ENCOUNTER — Other Ambulatory Visit: Payer: Self-pay | Admitting: Family Medicine

## 2019-07-08 NOTE — Telephone Encounter (Signed)
Routing to provider  

## 2019-07-08 NOTE — Telephone Encounter (Signed)
Requested medication (s) are due for refill today: Yes  Requested medication (s) are on the active medication list: Yes  Last refill:  02/05/19  Future visit scheduled: Yes  Notes to clinic:  See request.    Requested Prescriptions  Pending Prescriptions Disp Refills   mupirocin ointment (BACTROBAN) 2 % [Pharmacy Med Name: MUPIROCIN 2% OINTMENT] 66 g 0    Sig: PLACE 1 APPLICATION INTO THE NOSE 2 (TWO) TIMES DAILY.      Off-Protocol Failed - 07/08/2019  3:47 PM      Failed - Medication not assigned to a protocol, review manually.      Passed - Valid encounter within last 12 months    Recent Outpatient Visits           1 month ago Cerebral palsy, unspecified type [facility])   [facility] Particia Nearing, [place]   1 year ago Annual physical exam   [facility] Particia Nearing, [place]   2 years ago Annual physical exam   [facility] Particia Nearing, [place]   2 years ago Encounter to establish care   [facility], [place], [place]

## 2019-07-08 NOTE — Telephone Encounter (Signed)
Patient's mother is calling stating patient was taking Allegra prior to being transferred to [facility] and would like a script for this medication. Medication not seen on the patients chart. Please Advise. Mother also advised agent patient had seizure yesterday. Cb- [phone] [facility] [place]

## 2019-07-08 NOTE — Telephone Encounter (Signed)
Needs appt as this is new medication for her, and needs to f/u with her Neurologist regarding seizure

## 2019-07-08 NOTE — Telephone Encounter (Signed)
LOV: 03/06/20

## 2019-07-08 NOTE — Telephone Encounter (Signed)
Called to schedule appt and relay Rachel's message, no answer, vm full

## 2019-07-08 NOTE — Telephone Encounter (Signed)
Medication: diazepam (VALIUM) 5 MG tablet [[id number]]   Has the patient contacted their pharmacy? Yes  (Agent: If no, request that the patient contact the pharmacy for the refill.) (Agent: If yes, when and what did the pharmacy advise?)  Preferred Pharmacy (with phone number or street name[id number] [place], [place] - [address]  Phone:  [phone] Fax:  [phone]     Agent: Please be advised that RX refills may take up to 3 business days. We ask that you follow-up with your pharmacy.

## 2019-07-08 NOTE — Telephone Encounter (Signed)
Pt mom is aware of the below message. Pt is scheduled for virtual on 07-11-2019 245pm

## 2019-07-11 ENCOUNTER — Telehealth: Payer: Medicaid Other | Admitting: Family Medicine

## 2019-07-12 ENCOUNTER — Telehealth: Payer: Self-pay | Admitting: Family Medicine

## 2019-07-12 ENCOUNTER — Encounter: Payer: Self-pay | Admitting: Family Medicine

## 2019-07-12 ENCOUNTER — Telehealth (INDEPENDENT_AMBULATORY_CARE_PROVIDER_SITE_OTHER): Payer: Medicaid Other | Admitting: Family Medicine

## 2019-07-12 DIAGNOSIS — J3089 Other allergic rhinitis: Secondary | ICD-10-CM

## 2019-07-12 DIAGNOSIS — K5900 Constipation, unspecified: Secondary | ICD-10-CM

## 2019-07-12 MED ORDER — FEXOFENADINE HCL 180 MG PO TABS: 180.0000 mg | ORAL_TABLET | Freq: Every day | ORAL | 1 refills | Status: DC

## 2019-07-12 MED ORDER — FEXOFENADINE HCL 30 MG/5ML PO SUSP: 60.0000 mg | Freq: Every day | ORAL | 11 refills | Status: DC

## 2019-07-12 MED ORDER — LINACLOTIDE 72 MCG PO CAPS: 72.0000 ug | ORAL_CAPSULE | Freq: Every day | ORAL | 1 refills | Status: DC

## 2019-07-12 NOTE — Telephone Encounter (Signed)
Message already sent.

## 2019-07-12 NOTE — Progress Notes (Signed)
There were no vitals taken for this visit.   Subjective:    Patient ID: Charlene Gomez, female    DOB: 07-Jul-1990, 29 y.o.   MRN: [id number]  HPI: Charlene Gomez is a 29 y.o. female  Chief Complaint  Patient presents with  . Allergies    pt's mother states that the patient has been having drainage for about a week, requesting RX for Allegra  . Constipation    pt's mother states she would like something to help with bowels since the patient has scolosis    . This visit was completed via MyChart due to the restrictions of the COVID-19 pandemic. All issues as above were discussed and addressed. Physical exam was done as above through visual confirmation on MyChart. If it was felt that the patient should be evaluated in the office, they were directed there. The patient verbally consented to this visit. . Location of the patient: home . Location of the provider: home . Those involved with this call:  . Provider: Merrie Roof, PA-C . CMA: Lesle Chris, [place] . Front Desk/Registration: Jill Side  . Time spent on call: 15 minutes with patient face to face via video conference. More than 50% of this time was spent in counseling and coordination of care. 5 minutes total spent in review of patient's record and preparation of their chart. I verified patient identity using two factors (patient name and date of birth). Patient consents verbally to being seen via telemedicine visit today.   Patient presenting with rhinorrhea for about a week now. Hx is given by her mother, who is primary caregiver. Used to take allegra which has always worked well, hx of allergic rhinitis. Mother has not noticed fevers, chills, sweats, fatigue. Not currently trying anything for sxs.   Also having constipation ongoing due to her scoliosis. Tried linzess in the past which seemed to work. Mother states she can sometimes go nearly a week without BMs.   Relevant past medical, surgical, family and social history  reviewed and updated as indicated. Interim medical history since our last visit reviewed. Allergies and medications reviewed and updated.  Review of Systems  Per HPI unless specifically indicated above     Objective:    There were no vitals taken for this visit.  Wt Readings from Last 3 Encounters:  No data found for Wt    Physical Exam Constitutional:      Comments: Appearance at baseline  HENT:     Head: Atraumatic.     Mouth/Throat:     Mouth: Mucous membranes are moist.  Eyes:     Conjunctiva/sclera: Conjunctivae normal.  Pulmonary:     Effort: Pulmonary effort is normal. No respiratory distress.  Musculoskeletal:     Comments: In wheelchair  Skin:    General: Skin is warm and dry.  Neurological:     Mental Status: Mental status is at baseline.  Psychiatric:     Comments: At baseline     Results for orders placed or performed during the hospital encounter of 07/12/15  CBC with Differential/Platelet  Result Value Ref Range   WBC 4.2 3.6 - 11.0 K/uL   RBC 5.06 3.80 - 5.20 MIL/uL   Hemoglobin 15.3 12.0 - 16.0 g/dL   HCT 45.2 35.0 - 47.0 %   MCV 89.4 80.0 - 100.0 fL   MCH 30.2 26.0 - 34.0 pg   MCHC 33.8 32.0 - 36.0 g/dL   RDW 13.5 11.5 - 14.5 %   Platelets 273  150 - 440 K/uL   Neutrophils Relative % 60 %   Neutro Abs 2.6 1.4 - 6.5 K/uL   Lymphocytes Relative 28 %   Lymphs Abs 1.2 1.0 - 3.6 K/uL   Monocytes Relative 8 %   Monocytes Absolute 0.3 0.2 - 0.9 K/uL   Eosinophils Relative 3 %   Eosinophils Absolute 0.1 0 - 0.7 K/uL   Basophils Relative 1 %   Basophils Absolute 0.0 0 - 0.1 K/uL  Carbamazepine level, total  Result Value Ref Range   Carbamazepine Lvl 7.1 4.0 - 12.0 ug/mL      Assessment & Plan:   Problem List Items Addressed This Visit      Respiratory   Allergic rhinitis - Primary    Restart allegra syrup daily, monitor for benefit        Other   Constipation    Will restart linzess, push fluids and monitor for benefit           Follow up plan: Return for CPE.

## 2019-07-12 NOTE — Telephone Encounter (Signed)
Copied from CRM [id number]. Topic: General - Other >> Jul 12, 2019 11:52 AM Tamela Oddi wrote: Reason for CRM: Patient's mother called to inform the doctor that the script for fexofenadine [facility] ALLERGY) 180 MG tablet, needs to be sent in liquid form to the pharmacy because patient cannot take the tablet.  Please send to local pharmacy.  If there are any other questions, call [phone]

## 2019-07-12 NOTE — Telephone Encounter (Signed)
Prior Authorization initiated via [employer] for famotidine (PEPCID) 40 MG/5ML suspension Confirmation #: [id number] W  PA Approved

## 2019-07-15 ENCOUNTER — Telehealth: Payer: Self-pay | Admitting: Family Medicine

## 2019-07-15 DIAGNOSIS — K5900 Constipation, unspecified: Secondary | ICD-10-CM | POA: Insufficient documentation

## 2019-07-15 NOTE — Telephone Encounter (Signed)
Patient's mother notified.

## 2019-07-15 NOTE — Assessment & Plan Note (Signed)
Will restart linzess, push fluids and monitor for benefit

## 2019-07-15 NOTE — Telephone Encounter (Signed)
Allegra liquid ordered, linzess does not come in any formulation but capsules  Copied from CRM [id number]. Topic: General - Other >> Jul 12, 2019 11:19 AM Baldo Daub L wrote: Reason for CRM:   Pt's mother, Lyla Son, called and states that pt can not take pill form medication.  Wants to make sure that whatever was ordered today is in liquid form. Lyla Son can be reached at [phone].

## 2019-07-15 NOTE — Assessment & Plan Note (Signed)
Restart allegra syrup daily, monitor for benefit

## 2019-07-25 ENCOUNTER — Telehealth: Payer: Self-pay | Admitting: Family Medicine

## 2019-07-25 NOTE — Telephone Encounter (Signed)
Mother of pt, Charlene Gomez, called in after 5:00 and states that daughter was recently given fexofenadine (ALLEGRA) 30 MG/5ML suspension.  She feel that it is not working and is wanting something else. She says that she can hear some gurgling in her throat. She only wants another script but I informed her that someone may would need to reach out to her. She has taken over the care of her daughter, she says as her mother can no longer care for her and that she seems to not be doing well under her care.  CB is [phone]

## 2019-07-26 ENCOUNTER — Ambulatory Visit: Payer: Self-pay | Admitting: *Deleted

## 2019-07-26 NOTE — Telephone Encounter (Signed)
Mother is calling back- she is concerned that she has not heard back from PCP from yesterday's call. Mother reports: Sinus drainage - patient has had drainage for about 1 week. Patient has history of allergy- but this is the first time in a while. Patient is taking Allegra with relief. Patient is having: drainage- sore throat, decreased appetite, slight cough. No fever,no stuffy nose, no chest congestion.  Patient is high risk patient and mother is concerned- she doesn't want her to get worse. Assured mother that PCP has message and she will get call back today.

## 2019-07-26 NOTE — Telephone Encounter (Signed)
Pt's mother called and is requesting an urgent call back from the clinic.

## 2019-07-26 NOTE — Telephone Encounter (Signed)
Spoke with Pt's mother (DPR). Pt's mother stated they have issues with transportation due to PT's wheelchair Pt's mother stated "she's not that bad" ,Scheduled Pt for tomorrow 07/27/2019 per mother request instead of today, Did advise Pt's mother to go to [facility] or ED if SX worsen ,Pt's mother confirmed and verbalized understanding.

## 2019-07-26 NOTE — Telephone Encounter (Signed)
  Reason for Disposition . [1] Sinus congestion as part of a cold AND [2] present < 10 days  Answer Assessment - Initial Assessment Questions 1. LOCATION: "Where does it hurt?"      No pain noted- throat irritation 2. ONSET: "When did the sinus pain start?"  (e.g., hours, days)      1 week 3. SEVERITY: "How bad is the pain?"   (Scale 1-10; mild, moderate or severe)   - MILD (1-3): doesn't interfere with normal activities    - MODERATE (4-7): interferes with normal activities (e.g., work or school) or awakens from sleep   - SEVERE (8-10): excruciating pain and patient unable to do any normal activities        No pain 4. RECURRENT SYMPTOM: "Have you ever had sinus problems before?" If so, ask: "When was the last time?" and "What happened that time?"      History of allergy- has been some time 5. NASAL CONGESTION: "Is the nose blocked?" If so, ask, "Can you open it or must you breathe through the mouth?"     No nasal blockage 6. NASAL DISCHARGE: "Do you have discharge from your nose?" If so ask, "What color?"     n/a 7. FEVER: "Do you have a fever?" If so, ask: "What is it, how was it measured, and when did it start?"      no 8. OTHER SYMPTOMS: "Do you have any other symptoms?" (e.g., sore throat, cough, earache, difficulty breathing)     no 9. PREGNANCY: "Is there any chance you are pregnant?" "When was your last menstrual period?"     n/a  Protocols used: SINUS PAIN OR CONGESTION-A-AH

## 2019-07-26 NOTE — Telephone Encounter (Signed)
Routing to provider  

## 2019-07-26 NOTE — Telephone Encounter (Signed)
Needs in person eval ASAP, concern for aspiration pneumonia or other more serious conditions than just allergies given the extent of her co-morbidities

## 2019-07-27 ENCOUNTER — Ambulatory Visit: Payer: Medicaid Other | Admitting: Family Medicine

## 2019-07-27 ENCOUNTER — Other Ambulatory Visit: Payer: Self-pay | Admitting: Family Medicine

## 2019-07-27 ENCOUNTER — Other Ambulatory Visit: Payer: Self-pay

## 2019-07-27 ENCOUNTER — Encounter: Payer: Self-pay | Admitting: Family Medicine

## 2019-07-27 VITALS — BP 137/97 | HR 91 | Temp 97.5°F

## 2019-07-27 DIAGNOSIS — L84 Corns and callosities: Secondary | ICD-10-CM | POA: Diagnosis not present

## 2019-07-27 DIAGNOSIS — J3089 Other allergic rhinitis: Secondary | ICD-10-CM | POA: Diagnosis not present

## 2019-07-27 DIAGNOSIS — K5900 Constipation, unspecified: Secondary | ICD-10-CM | POA: Diagnosis not present

## 2019-07-27 MED ORDER — AMOXICILLIN 400 MG/5ML PO SUSR: 500.0000 mg | Freq: Two times a day (BID) | ORAL | 0 refills | Status: DC

## 2019-07-27 MED ORDER — LORATADINE 5 MG/5ML PO SYRP: 5.0000 mg | ORAL_SOLUTION | Freq: Every day | ORAL | 11 refills | Status: DC

## 2019-07-27 NOTE — Assessment & Plan Note (Signed)
Trial claritin, humidifier. Will send amoxil in case sxs worsening over weekend

## 2019-07-27 NOTE — Patient Instructions (Signed)
For the heel - you can try salicylic acid and/or corn pads from the foot care section of the store. You can also soak the foot in epsom salts and use a pumice stone or nail file to file down the area. Keep a cushion or pad on the area to avoid friction to the area

## 2019-07-27 NOTE — Telephone Encounter (Signed)
Pts Mother called in saying pharmacy sent her message saying they wouldn't be able to fill current prescription due to needing another alternative for Lo   loratadine (CLARITIN) 5 MG/5ML syrup [[id number]]     Please advise

## 2019-07-27 NOTE — Progress Notes (Signed)
BP (!) 137/97   Pulse 91   Temp (!) 97.5 F (36.4 C) (Oral)    Subjective:    Patient ID: Charlene Gomez, female    DOB: 1991-02-09, 29 y.o.   MRN: [id number]  HPI: Charlene Gomez is a 29 y.o. female  Chief Complaint  Patient presents with  . Allergies    throat congestion x about a week   Presenting today with mother who is primary caregiver for her. Mother states she's been dealing with significant post nasal drainage that's been causing her to accept less PO than usual. Zyrtec caused too much sedation, allegra hasn't seemed to improve sxs. Mother states she tends to get this way at least once a year and abx are the only thing that helps. Denies known fevers, sweats, cough, SOB.   Mother also notes a callus on left heel where she tends to kick her chair often. Has been applying lotion without benefit.   Relevant past medical, surgical, family and social history reviewed and updated as indicated. Interim medical history since our last visit reviewed. Allergies and medications reviewed and updated.  Review of Systems  Per HPI unless specifically indicated above     Objective:    BP (!) 137/97   Pulse 91   Temp (!) 97.5 F (36.4 C) (Oral)   Wt Readings from Last 3 Encounters:  No data found for Wt    Physical Exam Vitals and nursing note reviewed.  Constitutional:      Appearance: Normal appearance. She is not ill-appearing.  HENT:     Head: Atraumatic.     Right Ear: Tympanic membrane normal.     Left Ear: Tympanic membrane normal.     Nose: Nose normal.     Mouth/Throat:     Mouth: Mucous membranes are moist.     Pharynx: Posterior oropharyngeal erythema present.  Eyes:     Extraocular Movements: Extraocular movements intact.     Conjunctiva/sclera: Conjunctivae normal.  Cardiovascular:     Rate and Rhythm: Normal rate and regular rhythm.     Heart sounds: Normal heart sounds.  Pulmonary:     Effort: Pulmonary effort is normal. No respiratory  distress.     Breath sounds: Normal breath sounds. No wheezing.  Musculoskeletal:     Comments: ROM at baseline, wheelchair bound and with significant muscle tension from her CP.   Skin:    General: Skin is warm and dry.     Comments: Wart like callus area left heel  Neurological:     Mental Status: She is alert. Mental status is at baseline.     Results for orders placed or performed during the hospital encounter of 07/12/15  CBC with Differential/Platelet  Result Value Ref Range   WBC 4.2 3.6 - 11.0 K/uL   RBC 5.06 3.80 - 5.20 MIL/uL   Hemoglobin 15.3 12.0 - 16.0 g/dL   HCT 45.2 35.0 - 47.0 %   MCV 89.4 80.0 - 100.0 fL   MCH 30.2 26.0 - 34.0 pg   MCHC 33.8 32.0 - 36.0 g/dL   RDW 13.5 11.5 - 14.5 %   Platelets 273 150 - 440 K/uL   Neutrophils Relative % 60 %   Neutro Abs 2.6 1.4 - 6.5 K/uL   Lymphocytes Relative 28 %   Lymphs Abs 1.2 1.0 - 3.6 K/uL   Monocytes Relative 8 %   Monocytes Absolute 0.3 0.2 - 0.9 K/uL   Eosinophils Relative 3 %   Eosinophils  Absolute 0.1 0 - 0.7 K/uL   Basophils Relative 1 %   Basophils Absolute 0.0 0 - 0.1 K/uL  Carbamazepine level, total  Result Value Ref Range   Carbamazepine Lvl 7.1 4.0 - 12.0 ug/mL      Assessment & Plan:   Problem List Items Addressed This Visit      Respiratory   Allergic rhinitis - Primary    Trial claritin, humidifier. Will send amoxil in case sxs worsening over weekend        Other   Constipation    Currently taking linzess rarely, but having increasing issues with constipation. Discussed increasing frequency of use to daily if needed       Other Visit Diagnoses    Callus of foot       Wart vs callus - trial salicylic acid, filing and soaks       Follow up plan: Return if symptoms worsen or fail to improve.

## 2019-07-27 NOTE — Assessment & Plan Note (Signed)
Currently taking linzess rarely, but having increasing issues with constipation. Discussed increasing frequency of use to daily if needed

## 2019-07-27 NOTE — Telephone Encounter (Signed)
Requested medication (s) are due for refill today: yes  Requested medication (s) are on the active medication list:yes  Last refill:  07/27/19  Future visit scheduled: no  Notes to clinic:  pharmacy called Mom and she says they are requesting alternative. This is not covered    Requested Prescriptions  Pending Prescriptions Disp Refills   cetirizine HCl (ZYRTEC) 1 MG/ML solution [Pharmacy Med Name: CETIRIZINE HCL 1 MG/ML SOLN]  0    Sig: Please specify directions, refills and quantity      Ear, Nose, and Throat:  Antihistamines Passed - 07/27/2019  5:02 PM      Passed - Valid encounter within last 12 months    Recent Outpatient Visits           Today    [facility] [place], [place]   2 weeks ago Non-seasonal allergic rhinitis due to other allergic trigger   [facility], [place]   2 months ago Cerebral palsy, unspecified type [facility])   [facility] [place], [place]   1 year ago Annual physical exam   [facility] [place], [place]   2 years ago Annual physical exam   [facility], [place]

## 2019-07-28 NOTE — Telephone Encounter (Signed)
Please see if her mom is ok with zyrtec, she told me yesterday the zyrtec caused significant sedation

## 2019-07-28 NOTE — Telephone Encounter (Signed)
Spoke with mother. NO to Zyrtec. Patient said she called yesterday requesting her Claritin and antibiotic. (which was already sent in) Mother said must have been a mix up.  No further action is needed.

## 2020-05-22 ENCOUNTER — Telehealth: Payer: Self-pay | Admitting: Family Medicine

## 2020-05-22 NOTE — Telephone Encounter (Signed)
Burke,Carrie (mom) states patient was dx with COVID from a home test today. Caller is requesting ivermectin, patient did experience a fever of 100.3 but has it under control no fever. Patient is wheelchair bound and unable to communicate. Please reach to mother best # [phone]. If caller is unavailable okay to leave a detail message.   [id number] - [place], [place] - [address] Phone:  [phone]  Fax:  [phone]

## 2020-05-23 ENCOUNTER — Other Ambulatory Visit: Payer: Self-pay

## 2020-05-23 ENCOUNTER — Encounter: Payer: Self-pay | Admitting: Family Medicine

## 2020-05-23 ENCOUNTER — Telehealth (INDEPENDENT_AMBULATORY_CARE_PROVIDER_SITE_OTHER): Payer: Medicaid Other | Admitting: Family Medicine

## 2020-05-23 VITALS — Temp 98.5°F | Wt 100.0 lb

## 2020-05-23 DIAGNOSIS — Z20822 Contact with and (suspected) exposure to covid-19: Secondary | ICD-10-CM | POA: Diagnosis not present

## 2020-05-23 NOTE — Progress Notes (Signed)
Virtual Visit via Video Note  I connected with Charlene Gomez on 05/23/20 at  3:20 PM EST by a video enabled telemedicine application and verified that I am speaking with the correct person using two identifiers.  Location: Patient: home Provider: [facility]   I discussed the limitations of evaluation and management by telemedicine and the availability of in person appointments. The patient expressed understanding and agreed to proceed.  History of Present Illness:  COVID EXPOSURE - uncle and grandmother tested positive for COVID last week with symptom onset at least 7 days ago, lives with patient - Patient COVID negative today - uncle and grandmother have been isolating away from patient - denies cough, congestion, runny nose, N/V, fever. Is acting like herself.  - unvaccinated   Observations/Objective:  Patient's mother had trouble connecting to video visit, entirety of visit conducted over the phone with mother. Unable to examine patient.  Assessment and Plan:  COVID EXPOSURE With negative testing >7 days from exposure and no symptoms, reasonable to assume patient does not have COVID at this time, out of quarantine window. Recommend retesting if patient develops symptoms. Recommend vaccination. Emergency and return precautions discussed.     I discussed the assessment and treatment plan with the patient. The patient was provided an opportunity to ask questions and all were answered. The patient agreed with the plan and demonstrated an understanding of the instructions.   The patient was advised to call back or seek an in-person evaluation if the symptoms worsen or if the condition fails to improve as anticipated.  I provided 10 minutes of non-face-to-face time during this encounter.   Caro Laroche, DO

## 2020-05-23 NOTE — Patient Instructions (Signed)
It was great to meet you!  As long as Sashia is not having symptoms, has been at least 5 days from her exposure and has a negative test, it is safe to assume she does not have COVID. If she develops new symptoms, let us know and we will retest her.  Currently, we are not recommending post-exposure prophylaxis for COVID given the current national antibody and antiviral shortage.   I recommend getting vaccinated for COVID with either of the 2 mRNA vaccines, Moderna or [employer]. She can get this at the pharmacy.  Take care and seek immediate care sooner if you develop any concerns.   Dr. Linwood Dibbles

## 2020-05-28 ENCOUNTER — Telehealth: Payer: Self-pay

## 2020-05-28 NOTE — Telephone Encounter (Signed)
She needs to keep this appointment. Thanks!

## 2020-05-28 NOTE — Telephone Encounter (Signed)
Copied from CRM [id number]. Topic: General - Other >> May 28, 2020 10:02 AM Jaquita Rector A wrote: Reason for CRM: Patient mom Charlene Gomez called in to speak to Dr Linwood Dibbles say that patient had a positive Covid test on 05/27/20 and need to be placed on medication due to her medical diagnosis. Asking for a call back at   Ph# [phone]  Pt schduled for 05/29/20 with Dr.Rumball Pt would like to know if she needs apt or can medication be sent in due to just having apt on 05/23/2020 and is having issues talking, Pt's mother (DPR) stated she would have to talk for daughter and would like to know if she has to keep apt of can medication be sent in instead.

## 2020-05-28 NOTE — Telephone Encounter (Signed)
Pt's mom (DPR) verbarbalized understanding.

## 2020-05-29 ENCOUNTER — Other Ambulatory Visit: Payer: Self-pay

## 2020-05-29 ENCOUNTER — Encounter: Payer: Self-pay | Admitting: Family Medicine

## 2020-05-29 ENCOUNTER — Telehealth: Payer: Medicaid Other | Admitting: Family Medicine

## 2020-05-29 ENCOUNTER — Telehealth (INDEPENDENT_AMBULATORY_CARE_PROVIDER_SITE_OTHER): Payer: Medicaid Other | Admitting: Family Medicine

## 2020-05-29 DIAGNOSIS — U071 COVID-19: Secondary | ICD-10-CM | POA: Diagnosis not present

## 2020-05-29 NOTE — Progress Notes (Signed)
Virtual Visit via Video Note  I connected with [name] on 05/29/20 at  2:20 PM EST by a video enabled telemedicine application and verified that I am speaking with the correct person using two identifiers.  Location: Patient: home Provider: [facility]   I discussed the limitations of evaluation and management by telemedicine and the availability of in person appointments. The patient expressed understanding and agreed to proceed.  History of Present Illness:  UPPER RESPIRATORY TRACT INFECTION - COVID+ 05/27/20. COVID exposure with uncle and grandmother, on ivermectin and antibiotics. - diarrhea, fever - unvaccinated  Worst symptom: Fever: yes, Tmax 101F Cough: yes Shortness of breath: no Wheezing: no Chest pain: nonverbal Chest tightness: no Chest congestion: no Nasal congestion: no Runny nose: no Sneezing: no Sore throat: no Headache: no Eyes red/itching:no Eye drainage/crusting: no  Vomiting: no Sick contacts: yes Recurrent sinusitis: no Relief with OTC cold/cough medications: hasn't tried  Treatments attempted: none   Observations/Objective:  Appears at baseline. Nonverbal, in wheelchair. No respiratory distress  Assessment and Plan:  COVID-19 Day 3 of illness, overall doing well. Unvaccinated and at risk for severe complications, referred for COVID tx. Recommended maintaining adequate hydration. May try OTC symptom relief if develops congestion, runny nose. Reviewed quarantine guidelines and emergency precautions.   I discussed the assessment and treatment plan with the patient. The patient was provided an opportunity to ask questions and all were answered. The patient agreed with the plan and demonstrated an understanding of the instructions.   The patient was advised to call back or seek an in-person evaluation if the symptoms worsen or if the condition fails to improve as anticipated.  I provided 8 minutes of non-face-to-face time during this  encounter.   Caro Laroche, DO

## 2020-05-29 NOTE — Patient Instructions (Signed)
It was great to see you!  Our plans for today:  - See below for self-isolation guidelines. You may end your quarantine once you are 10 days from symptom onset and fever free for 24 hours without use of tylenol or ibuprofen.  - We are referring you for COVID treatment with either the monoclonal antibodies or oral antivirals. Someone will be contacting you about scheduling this if you qualify. We are experiencing a Sport and exercise psychologist of this and the oral antivirals as well as a backlog of patients needing treatment so this may cause a delay. - I recommend getting vaccinated once you are healed from your current infection. If you get the antibody infusion, you must wait 3 months before vaccination.  - Certainly, if you are having difficulties breathing or unable to keep down fluids, go to the Emergency Department.   Take care and seek immediate care sooner if you develop any concerns.   Dr. Linwood Dibbles  COVID-19 Vaccine Information can be found at: [email or web address] For questions related to vaccine distribution or appointments, please email [email or web address]@ [email or web address] or call [phone].      Person Under Monitoring Name: Charlene Gomez  Location: [address] [place] [address]   Infection Prevention Recommendations for Individuals Confirmed to have, or Being Evaluated for, 2019 Novel Coronavirus (COVID-19) Infection Who Receive Care at Home  Individuals who are confirmed to have, or are being evaluated for, COVID-19 should follow the prevention steps below until a healthcare provider or local or state health department says they can return to normal activities.  Stay home except to get medical care You should restrict activities outside your home, except for getting medical care. Do not go to work, school, or public areas, and do not use public transportation or taxis.  Call ahead before visiting your doctor Before your medical  appointment, call the healthcare provider and tell them that you have, or are being evaluated for, COVID-19 infection. This will help the healthcare provider's office take steps to keep other people from getting infected. Ask your healthcare provider to call the local or state health department.  Monitor your symptoms Seek prompt medical attention if your illness is worsening (e.g., difficulty breathing). Before going to your medical appointment, call the healthcare provider and tell them that you have, or are being evaluated for, COVID-19 infection. Ask your healthcare provider to call the local or state health department.  Wear a facemask You should wear a facemask that covers your nose and mouth when you are in the same room with other people and when you visit a healthcare provider. People who live with or visit you should also wear a facemask while they are in the same room with you.  Separate yourself from other people in your home As much as possible, you should stay in a different room from other people in your home. Also, you should use a separate bathroom, if available.  Avoid sharing household items You should not share dishes, drinking glasses, cups, eating utensils, towels, bedding, or other items with other people in your home. After using these items, you should wash them thoroughly with soap and water.  Cover your coughs and sneezes Cover your mouth and nose with a tissue when you cough or sneeze, or you can cough or sneeze into your sleeve. Throw used tissues in a lined trash can, and immediately wash your hands with soap and water for at least 20 seconds or use an alcohol-based hand rub.  Wash your Interior and spatial designer your  hands often and thoroughly with soap and water for at least 20 seconds. You can use an alcohol-based hand sanitizer if soap and water are not available and if your hands are not visibly dirty. Avoid touching your eyes, nose, and mouth with unwashed  hands.   Prevention Steps for Caregivers and Household Members of Individuals Confirmed to have, or Being Evaluated for, COVID-19 Infection Being Cared for in the Home  If you live with, or provide care at home for, a person confirmed to have, or being evaluated for, COVID-19 infection please follow these guidelines to prevent infection:  Follow healthcare provider's instructions Make sure that you understand and can help the patient follow any healthcare provider instructions for all care.  Provide for the patient's basic needs You should help the patient with basic needs in the home and provide support for getting groceries, prescriptions, and other personal needs.  Monitor the patient's symptoms If they are getting sicker, call his or her medical provider and tell them that the patient has, or is being evaluated for, COVID-19 infection. This will help the healthcare provider's office take steps to keep other people from getting infected. Ask the healthcare provider to call the local or state health department.  Limit the number of people who have contact with the patient  If possible, have only one caregiver for the patient.  Other household members should stay in another home or place of residence. If this is not possible, they should stay  in another room, or be separated from the patient as much as possible. Use a separate bathroom, if available.  Restrict visitors who do not have an essential need to be in the home.  Keep older adults, very young children, and other sick people away from the patient Keep older adults, very young children, and those who have compromised immune systems or chronic health conditions away from the patient. This includes people with chronic heart, lung, or kidney conditions, diabetes, and cancer.  Ensure good ventilation Make sure that shared spaces in the home have good air flow, such as from an air conditioner or an opened window, weather  permitting.  Wash your hands often  Wash your hands often and thoroughly with soap and water for at least 20 seconds. You can use an alcohol based hand sanitizer if soap and water are not available and if your hands are not visibly dirty.  Avoid touching your eyes, nose, and mouth with unwashed hands.  Use disposable paper towels to dry your hands. If not available, use dedicated cloth towels and replace them when they become wet.  Wear a facemask and gloves  Wear a disposable facemask at all times in the room and gloves when you touch or have contact with the patient's blood, body fluids, and/or secretions or excretions, such as sweat, saliva, sputum, nasal mucus, vomit, urine, or feces.  Ensure the mask fits over your nose and mouth tightly, and do not touch it during use.  Throw out disposable facemasks and gloves after using them. Do not reuse.  Wash your hands immediately after removing your facemask and gloves.  If your personal clothing becomes contaminated, carefully remove clothing and launder. Wash your hands after handling contaminated clothing.  Place all used disposable facemasks, gloves, and other waste in a lined container before disposing them with other household waste.  Remove gloves and wash your hands immediately after handling these items.  Do not share dishes, glasses, or other household items with the patient  Avoid  sharing household items. You should not share dishes, drinking glasses, cups, eating utensils, towels, bedding, or other items with a patient who is confirmed to have, or being evaluated for, COVID-19 infection.  After the person uses these items, you should wash them thoroughly with soap and water.  Wash laundry thoroughly  Immediately remove and wash clothes or bedding that have blood, body fluids, and/or secretions or excretions, such as sweat, saliva, sputum, nasal mucus, vomit, urine, or feces, on them.  Wear gloves when handling laundry from  the patient.  Read and follow directions on labels of laundry or clothing items and detergent. In general, wash and dry with the warmest temperatures recommended on the label.  Clean all areas the individual has used often  Clean all touchable surfaces, such as counters, tabletops, doorknobs, bathroom fixtures, toilets, phones, keyboards, tablets, and bedside tables, every day. Also, clean any surfaces that may have blood, body fluids, and/or secretions or excretions on them.  Wear gloves when cleaning surfaces the patient has come in contact with.  Use a diluted bleach solution (e.g., dilute bleach with 1 part bleach and 10 parts water) or a household disinfectant with a label that says EPA-registered for coronaviruses. To make a bleach solution at home, add 1 tablespoon of bleach to 1 quart (4 cups) of water. For a larger supply, add  cup of bleach to 1 gallon (16 cups) of water.  Read labels of cleaning products and follow recommendations provided on product labels. Labels contain instructions for safe and effective use of the cleaning product including precautions you should take when applying the product, such as wearing gloves or eye protection and making sure you have good ventilation during use of the product.  Remove gloves and wash hands immediately after cleaning.  Monitor yourself for signs and symptoms of illness Caregivers and household members are considered close contacts, should monitor their health, and will be asked to limit movement outside of the home to the extent possible. Follow the monitoring steps for close contacts listed on the symptom monitoring form.   ? If you have additional questions, contact your local health department or call the epidemiologist on call at [phone] (available 24/7). ? This guidance is subject to change. For the most up-to-date guidance from [facility], please refer to their  website: [email or web address]

## 2020-05-30 ENCOUNTER — Telehealth: Payer: Self-pay | Admitting: *Deleted

## 2020-05-30 ENCOUNTER — Other Ambulatory Visit: Payer: Self-pay | Admitting: Family

## 2020-05-30 MED ORDER — MOLNUPIRAVIR EUA 200MG CAPSULE: 4.0000 | ORAL_CAPSULE | Freq: Two times a day (BID) | ORAL | 0 refills | Status: AC

## 2020-05-30 MED ORDER — PROMETHAZINE-DM 6.25-15 MG/5ML PO SYRP: 5.0000 mL | ORAL_SOLUTION | Freq: Four times a day (QID) | ORAL | 0 refills | Status: DC | PRN

## 2020-05-30 NOTE — Telephone Encounter (Signed)
Called to discuss with patient about COVID-19 symptoms and the use of one of the available treatments for those with mild to moderate Covid symptoms and at a high risk of hospitalization.  Pt appears to qualify for outpatient treatment due to co-morbid conditions and/or a member of an at-risk group in accordance with the FDA Emergency Use Authorization.   Fever, diarrhea.    Symptom onset: Saturday 1/29 or Sunday 1/30. Vaccinated: no Booster? no Immunocompromised? no Qualifiers: Seizures, scoliosis she is wheelchair bound.  [name] would like a call from APP. Everyone else in the home is on Paxlovid.    Charlene Gomez

## 2020-05-30 NOTE — Telephone Encounter (Signed)
Outpatient Oral COVID Treatment Note  I connected with Charlene Gomez on 05/30/2020/1:38 PM by telephone and verified that I am speaking with the correct person using two identifiers. Connected with her mother who is her caretaker.   I discussed the limitations, risks, security, and privacy concerns of performing an evaluation and management service by telephone and the availability of in person appointments. I also discussed with the patient that there may be a patient responsible charge related to this service. The patient expressed understanding and agreed to proceed.  Patient location: [place] residence Provider location: home  Diagnosis: COVID-19 infection  Purpose of visit: Discussion of potential use of Molnupiravir or Paxlovid, a new treatment for mild to moderate COVID-19 viral infection in non-hospitalized patients.   Subjective: Patient is a 30 y.o. female who has been diagnosed with COVID 19 viral infection.  Their symptoms began on 05/26/20 with diarrhea though no fever til 05/27/20.  Past Medical History:  Diagnosis Date  . CP (cerebral palsy) (HCC)   . Developmental delay   . Premature birth   . Scoliosis   . Seizures (HCC)     Allergies  Allergen Reactions  . Cefprozil Other (See Comments)     Current Outpatient Medications:  .  carbamazepine (CARBATROL) 200 MG 12 hr capsule, Take 200 mg by mouth 2 (two) times daily., Disp: , Rfl: 6 .  diazepam (DIASTAT) 2.5 MG GEL, Place 2.5 mg rectally once. , Disp: , Rfl:  .  diazepam (VALIUM) 5 MG tablet, Take 5 mg by mouth every 6 (six) hours as needed for anxiety., Disp: , Rfl:  .  fexofenadine (ALLEGRA) 30 MG/5ML suspension, Take 30 mg by mouth daily., Disp: , Rfl:  .  linaclotide (LINZESS) 72 MCG capsule, Take 1 capsule (72 mcg total) by mouth daily before breakfast., Disp: 90 capsule, Rfl: 1 .  mupirocin ointment (BACTROBAN) 2 %, PLACE 1 APPLICATION INTO THE NOSE 2 (TWO) TIMES DAILY. (Patient not taking: Reported on 05/29/2020),  Disp: 66 g, Rfl: 0 .  nystatin cream (MYCOSTATIN), Apply topically 2 (two) times daily as needed. (Patient not taking: Reported on 05/29/2020), Disp: 30 g, Rfl: 2  Objective: They are in no apparent distress.  Breathing is non labored.  Mood and behavior are normal.  Laboratory Data:  No results found for this or any previous visit (from the past 2160 hour(s)).   Assessment: 30 y.o. female with mild/moderate COVID 19 viral infection diagnosed on 05/27/20 at high risk for progression to severe COVID 19.  Plan:  This patient is a 30 y.o. female that meets the following criteria for Emergency Use Authorization of: Molnupiravir  1. Age >18 yr 2. SARS-COV-2 positive test 3. Symptom onset < 5 days 4. Mild-to-moderate COVID disease with high risk for severe progression to hospitalization or death   I have spoken and communicated the following to the patient or parent/caregiver regarding: 1. Molnupiravir is an unapproved drug that is authorized for use under an TEFL teacher.  2. There are no adequate, approved, available products for the treatment of COVID-19 in adults who have mild-to-moderate COVID-19 and are at high risk for progressing to severe COVID-19, including hospitalization or death. 3. Other therapeutics are currently authorized. For additional information on all products authorized for treatment or prevention of COVID-19, please see https://www.graham-miller.com/.  4. There are benefits and risks of taking this treatment as outlined in the "Fact Sheet for Patients and Caregivers."  5. "Fact Sheet for Patients and Caregivers" was reviewed with patient.  A hard copy will be provided to patient from pharmacy prior to the patient receiving treatment. 6. Patients should continue to self-isolate and use infection control measures (e.g., wear mask, isolate, social distance, avoid sharing  personal items, clean and disinfect "high touch" surfaces, and frequent handwashing) according to CDC guidelines.  7. The patient or parent/caregiver has the option to accept or refuse treatment. 8. [employer] has established a pregnancy surveillance program. 9. Females of childbearing potential should use a reliable method of contraception correctly and consistently, as applicable, for the duration of treatment and for 4 days after the last dose of Molnupiravir. 10. Males of reproductive potential who are sexually active with females of childbearing potential should use a reliable method of contraception correctly and consistently during treatment and for at least 3 months after the last dose. 11. Pregnancy status and risk was assessed. Patient verbalized understanding of precautions.   After reviewing above information with the patient, the patient's caretaker agrees for the patient to receive molnupiravir.  Follow up instructions:    . Take prescription BID x 5 days as directed . Reach out to pharmacist for counseling on medication if desired . For concerns regarding further COVID symptoms please follow up with your PCP or urgent care . For urgent or life-threatening issues, seek care at your local emergency department  The patient was provided an opportunity to ask questions, and all were answered. The patient agreed with the plan and demonstrated an understanding of the instructions.   Script sent to [facility] and opted to pick up RX.  The patient was advised to call their PCP or seek an in-person evaluation if the symptoms worsen or if the condition fails to improve as anticipated.   Of note, lengthy discussion with patients mother as well as pharmacist. Patient has cerebral palsy and is unvaccinated. Patient's caretaker (her mother) repeatedly declines MAB and declines assistance with transportation. No recent lab work available for [employer] consideration. We  reviewed that Molnupiravir CAN NOT be crushed, open, or chewed per my discussion with the pharmacist. The patients mother believes she will be able to take the pill whole with applesauce but verbalizes understanding that it can not be crushed and will stop the Rx if cannot complete. Patient's mother asks about Ivermectin and I relayed that there is not research showing it is effective at this time and recommended not utilizing. Patient's mother also asks about antibiotics and we discussed that antibiotics are not effective to treat a viral infection.   I provided 15 minutes of non face-to-face telephone visit time during this encounter, and > 50% was spent counseling as documented under my assessment & plan.  Alver Sorrow, NP 05/30/2020 /1:38 PM

## 2020-07-01 ENCOUNTER — Other Ambulatory Visit: Payer: Self-pay | Admitting: Family Medicine

## 2020-07-13 ENCOUNTER — Telehealth: Payer: Self-pay | Admitting: Nurse Practitioner

## 2020-07-13 MED ORDER — FAMOTIDINE 40 MG/5ML PO SUSR: 40.0000 mg | Freq: Every day | ORAL | 0 refills | Status: DC

## 2020-07-13 NOTE — Telephone Encounter (Signed)
Medication requested is not on current medication list

## 2020-07-13 NOTE — Telephone Encounter (Signed)
Sent to pharmacy 

## 2020-07-13 NOTE — Telephone Encounter (Signed)
The medication is in the patient's chart under historic medications. The medication was stopped back in January of 2021 by Evalyn Casco. Please advise.

## 2020-07-13 NOTE — Telephone Encounter (Signed)
Does patient take this as a liquid?

## 2020-07-13 NOTE — Telephone Encounter (Unsigned)
Copied from CRM [id number]. Topic: Quick Communication - Rx Refill/Question >> Jul 13, 2020  9:39 AM Marylen Ponto wrote: Medication: famotidine (PEPCID) 40 MG/5ML suspension   Has the patient contacted their pharmacy? yes   Preferred Pharmacy (with phone number or street name): [id number] - [place], [place] - [address]  Phone: [phone]   Fax: [phone]  Agent: Please be advised that RX refills may take up to 3 business days. We ask that you follow-up with your pharmacy.

## 2020-07-13 NOTE — Telephone Encounter (Signed)
Yes

## 2020-07-27 ENCOUNTER — Other Ambulatory Visit: Payer: Self-pay | Admitting: Nurse Practitioner

## 2020-07-27 NOTE — Telephone Encounter (Signed)
Medication: carbamazepine (CARBATROL) 200 MG 12 hr capsule  Has the pt contacted their pharmacy? No she said she does not call the pharmacy for this medication  Preferred pharmacy: [id number] - [place], [place] - [address]  Please be advised refills may take up to 3 business days.  We ask that you follow up with your pharmacy.

## 2020-07-27 NOTE — Telephone Encounter (Signed)
Requested medication (s) are due for refill today:  Provider to review  Requested medication (s) are on the active medication list:   Yes but from 2018 from a historical provider  Future visit scheduled:   No   Seen a mo. Ago by Ellwood Dense   Last ordered: 08/28/2016 by a historical provider  Clinic note:   Returned because this is a non delegated refill plus it was last prescribed by a historical provider in 2018.   Requested Prescriptions  Pending Prescriptions Disp Refills   carbamazepine (CARBATROL) 200 MG 12 hr capsule  6    Sig: Take 1 capsule (200 mg total) by mouth 2 (two) times daily.      Not Delegated - Neurology:  Anticonvulsants - carbamazepine Failed - 07/27/2020  7:46 AM      Failed - This refill cannot be delegated      Failed - AST in normal range and within 90 days    SGOT(AST)  Date Value Ref Range Status  10/15/2012 18 15 - 37 Unit/L Final          Failed - ALT in normal range and within 90 days    SGPT (ALT)  Date Value Ref Range Status  10/15/2012 19 12 - 78 U/L Final          Failed - Carbamazepine (serum) in normal range and within 360 days    Carbamazepine Lvl  Date Value Ref Range Status  07/12/2015 7.1 4.0 - 12.0 ug/mL Final          Failed - WBC in normal range and within 90 days    WBC  Date Value Ref Range Status  07/12/2015 4.2 3.6 - 11.0 K/uL Final          Failed - PLT in normal range and within 90 days    Platelets  Date Value Ref Range Status  07/12/2015 273 150 - 440 K/uL Final   Platelet  Date Value Ref Range Status  10/15/2012 209 150 - 440 x10 3/mm 3 Final          Failed - HGB in normal range and within 90 days    Hemoglobin  Date Value Ref Range Status  07/12/2015 15.3 12.0 - 16.0 g/dL Final   HGB  Date Value Ref Range Status  10/15/2012 15.5 12.0 - 16.0 g/dL Final          Failed - Na in normal range and within 90 days    Sodium  Date Value Ref Range Status  10/15/2012 140 136 - 145 mmol/L Final           Failed - HCT in normal range and within 90 days    HCT  Date Value Ref Range Status  07/12/2015 45.2 35.0 - 47.0 % Final  10/15/2012 45.3 35.0 - 47.0 % Final          Passed - Valid encounter within last 12 months    Recent Outpatient Visits           1 month ago COVID-19   [facility] Caro Laroche, DO   2 months ago Close exposure to COVID-19 virus   [facility] Caro Laroche, DO   1 year ago Non-seasonal allergic rhinitis due to other allergic trigger   [facility] Particia Nearing, PA-C   1 year ago Non-seasonal allergic rhinitis due to other allergic trigger   [facility] Particia Nearing, [place]   1  year ago Cerebral palsy, unspecified type [facility])   [facility] Roosvelt Maser [place], [place]

## 2020-08-06 ENCOUNTER — Other Ambulatory Visit: Payer: Self-pay | Admitting: Family Medicine

## 2020-08-06 NOTE — Telephone Encounter (Signed)
Requested medication (s) are due for refill today: no  Requested medication (s) are on the active medication list: yes  Last refill:  07/12/2019  Future visit scheduled: no  Notes to clinic: this script has expired  Review for refill    Requested Prescriptions  Pending Prescriptions Disp Refills   LINZESS 72 MCG capsule [Pharmacy Med Name: LINZESS 72 MCG CAPSULE] 90 capsule 1    Sig: TAKE 1 CAPSULE (72 MCG TOTAL) BY MOUTH DAILY BEFORE BREAKFAST.      Gastroenterology: Irritable Bowel Syndrome Passed - 08/06/2020  9:07 AM      Passed - Valid encounter within last 12 months    Recent Outpatient Visits           2 months ago COVID-19   [facility] Caro Laroche, DO   2 months ago Close exposure to COVID-19 virus   [facility] Linwood Dibbles, Darl Householder, DO   1 year ago Non-seasonal allergic rhinitis due to other allergic trigger   [facility] Particia Nearing, [place]   1 year ago Non-seasonal allergic rhinitis due to other allergic trigger   [facility] Particia Nearing, [place]   1 year ago Cerebral palsy, unspecified type [facility])   [facility] Particia Nearing, [place]

## 2020-08-06 NOTE — Telephone Encounter (Signed)
Pt scheduled for 08/09/2020 would like medication to be bridged over as she is completely out of medication

## 2020-08-09 ENCOUNTER — Other Ambulatory Visit: Payer: Self-pay

## 2020-08-09 ENCOUNTER — Telehealth: Payer: Medicaid Other | Admitting: Nurse Practitioner

## 2020-12-10 ENCOUNTER — Telehealth: Payer: Medicaid Other | Admitting: Nurse Practitioner

## 2020-12-10 NOTE — Telephone Encounter (Signed)
The Balloon for pts Gtube has burst and the vendor is going to send her a replacement but she wants to see if the order can be revised to come monthly instead of quarterly due to bursting frequently / fax# for [employer] the supplier is [phone]/ please advise

## 2020-12-11 ENCOUNTER — Other Ambulatory Visit: Payer: Self-pay

## 2020-12-11 DIAGNOSIS — Z931 Gastrostomy status: Secondary | ICD-10-CM

## 2020-12-11 DIAGNOSIS — M4146 Neuromuscular scoliosis, lumbar region: Secondary | ICD-10-CM

## 2020-12-11 DIAGNOSIS — G809 Cerebral palsy, unspecified: Secondary | ICD-10-CM

## 2020-12-11 NOTE — Telephone Encounter (Signed)
Ready for signature

## 2020-12-12 NOTE — Telephone Encounter (Signed)
Faxed

## 2021-05-01 ENCOUNTER — Telehealth: Payer: Self-pay | Admitting: Nurse Practitioner

## 2021-05-01 NOTE — Telephone Encounter (Signed)
Patient has not had an office visit since March 2021. Needs an appointment before paperwork can be filled out.

## 2021-05-01 NOTE — Telephone Encounter (Signed)
Pts mother states pt is handicapped and it is hard to get her out of the house. Scheduled pt a mychart visit. States she has a smartphone and the link can be sent to the number we have on file.

## 2021-05-01 NOTE — Telephone Encounter (Signed)
Copied from CRM [id number]. Topic: General - Other >> May 01, 2021 12:41 PM [facility], [facility] doctor R wrote: Reason for CRM: caller from [facility] calling to check on the status of paper work that she faxed over from 11/10, 11/18, ans 12/29. Would like a call back if additional things are needed before paper work is signed

## 2021-05-03 ENCOUNTER — Encounter: Payer: Self-pay | Admitting: Nurse Practitioner

## 2021-05-03 ENCOUNTER — Telehealth (INDEPENDENT_AMBULATORY_CARE_PROVIDER_SITE_OTHER): Payer: Medicaid Other | Admitting: Nurse Practitioner

## 2021-05-03 DIAGNOSIS — Z0289 Encounter for other administrative examinations: Secondary | ICD-10-CM

## 2021-05-03 DIAGNOSIS — G809 Cerebral palsy, unspecified: Secondary | ICD-10-CM

## 2021-05-03 NOTE — Progress Notes (Signed)
LMP 04/01/2021 (Approximate)    Subjective:    Patient ID: Charlene Gomez, female    DOB: 1990-12-13, 31 y.o.   MRN: [id number]  HPI: Charlene Gomez is a 31 y.o. female  Chief Complaint  Patient presents with   Urinary Incontinence   Patient seen today via telehealth visit to follow up on incontinence supplies.  Patient's mom states she has no concerns. Patient is doing well. Continues to follow up with Dr. Melrose Nakayama.  Relevant past medical, surgical, family and social history reviewed and updated as indicated. Interim medical history since our last visit reviewed. Allergies and medications reviewed and updated.  Review of Systems  Genitourinary:        Urinary incontinence   Per HPI unless specifically indicated above     Objective:    LMP 04/01/2021 (Approximate)   Wt Readings from Last 3 Encounters:  05/23/20 100 lb (45.4 kg)    Physical Exam Vitals and nursing note reviewed.  Pulmonary:     Effort: Pulmonary effort is normal. No respiratory distress.  Neurological:     Mental Status: She is alert.  Psychiatric:        Mood and Affect: Mood normal.        Behavior: Behavior normal.        Thought Content: Thought content normal.        Judgment: Judgment normal.    Results for orders placed or performed during the hospital encounter of 07/12/15  CBC with Differential/Platelet  Result Value Ref Range   WBC 4.2 3.6 - 11.0 K/uL   RBC 5.06 3.80 - 5.20 MIL/uL   Hemoglobin 15.3 12.0 - 16.0 g/dL   HCT 45.2 35.0 - 47.0 %   MCV 89.4 80.0 - 100.0 fL   MCH 30.2 26.0 - 34.0 pg   MCHC 33.8 32.0 - 36.0 g/dL   RDW 13.5 11.5 - 14.5 %   Platelets 273 150 - 440 K/uL   Neutrophils Relative % 60 %   Neutro Abs 2.6 1.4 - 6.5 K/uL   Lymphocytes Relative 28 %   Lymphs Abs 1.2 1.0 - 3.6 K/uL   Monocytes Relative 8 %   Monocytes Absolute 0.3 0.2 - 0.9 K/uL   Eosinophils Relative 3 %   Eosinophils Absolute 0.1 0 - 0.7 K/uL   Basophils Relative 1 %   Basophils Absolute  0.0 0 - 0.1 K/uL  Carbamazepine level, total  Result Value Ref Range   Carbamazepine Lvl 7.1 4.0 - 12.0 ug/mL      Assessment & Plan:   Problem List Items Addressed This Visit       Nervous and Auditory   CP (cerebral palsy) ([place]) - Primary    Chronic. Controlled on current medication regimen.  Form signed for patient for incontinence supplies.  Will need to be seen in office in 6 months for lab work and follow up.      Other Visit Diagnoses     Encounter for completion of form with patient       Form completed for patient during visit today.         Follow up plan: Return in about 3 months (around 08/01/2021) for Physical and Fasting labs.    This visit was completed via MyChart due to the restrictions of the COVID-19 pandemic. All issues as above were discussed and addressed. Physical exam was done as above through visual confirmation on MyChart. If it was felt that the patient should be evaluated in the  office, they were directed there. The patient verbally consented to this visit. Location of the patient: Home Location of the provider: Office Those involved with this call:  Provider: Jon Billings, NP CMA: Yvonna Alanis, [place] Desk/Registration: Myrlene Broker This encounter was conducted via phone.  I spent 15 dedicated to the care of this patient on the date of this encounter to include previsit review of 21, time with the patient, and post visit ordering of testing.

## 2021-05-03 NOTE — Assessment & Plan Note (Signed)
Chronic. Controlled on current medication regimen.  Form signed for patient for incontinence supplies.  Will need to be seen in office in 6 months for lab work and follow up.

## 2021-05-15 ENCOUNTER — Ambulatory Visit: Payer: Self-pay | Admitting: *Deleted

## 2021-05-15 NOTE — Telephone Encounter (Signed)
Patient has not had an in office appointment for this.  She would need to be seen.  Does mom know how to Cath the patient? If not, catheters can't be ordered. We could potentially get a bag that sticks on to collect urine.

## 2021-05-15 NOTE — Telephone Encounter (Signed)
°  Summary: possible uti/unable to pee in cup   Mom Morey Hummingbird states the dr wants to do a UA on the pt to check for UTI.  Mom states pt is handicapped, in a wheelchair, bed bound, and cannot pee in a cup. She wants to know what else can be done at this point. The office is at lunch. Pt is not complaining or crying, so mom thinks this is ridiculous. I do not see any orders. Pt had video visit this morning.        Chief Complaint: does patient need a urine sample?  Symptoms: na Frequency: na Pertinent Negatives: Patient denies na Disposition: [] ED /[] Urgent Care (no appt availability in office) / [] Appointment(In office/virtual)/ []  [facility]/ [] Home Care/ [] Refused Recommended Disposition /[] [facility]/ [x]  Follow-up with PCP Additional Notes:  VV today and patient's mother would like clarification if patient needs urine sample. If so patient is incontinent and unable to submit sample. Patient will require in and out catherization if urine sample needed. No order noted. Please advise.      Reason for Disposition  [1] Caller requesting NON-URGENT health information AND [2] PCP's office is the best resource  Answer Assessment - Initial Assessment Questions 1. REASON FOR CALL or QUESTION: "What is your reason for calling today?" or "How can I best help you?" or "What question do you have that I can help answer?"     Patient's mother would like to know if patient is to submit a urine sample .  Protocols used: Information Only Call - No Triage-A-AH

## 2021-05-15 NOTE — Telephone Encounter (Signed)
Spoke with pts mother to clarify because pt did not have an appt today. Pts mom states that pt is okay. She would just like to know how to get catheters if she needs to get a urine sample in the future. Please advise.

## 2021-05-15 NOTE — Telephone Encounter (Signed)
Patient mother states she would like catheter ordered along with the diaper supply that she would be receiving soon. Patient mother denies any UTI symptoms, would like to have caths at home if needed in the future.

## 2021-05-21 NOTE — Telephone Encounter (Signed)
At this time I am not going to order supplies for patient's mom to cath the patient.  If there is a concern about a UTI then we will address it at that time.  Patient may need to be seen by Urology or have home health come in for a refresher on catheterization.

## 2021-07-15 ENCOUNTER — Other Ambulatory Visit: Payer: Self-pay | Admitting: Nurse Practitioner

## 2021-07-16 NOTE — Telephone Encounter (Signed)
Requested Prescriptions  ?Pending Prescriptions Disp Refills  ?? LINZESS 72 MCG capsule [Pharmacy Med Name: LINZESS 72 MCG CAPSULE] 30 capsule 0  ?  Sig: TAKE 1 CAPSULE BY MOUTH DAILY BEFORE BREAKFAST.  ?  ? Gastroenterology: Irritable Bowel Syndrome Passed - 07/15/2021  9:28 AM  ?  ?  Passed - Valid encounter within last 12 months  ?  Recent Outpatient Visits   ?      ? 2 months ago Cerebral palsy, unspecified type (HCC)  ? [facility] Larae Grooms, NP  ? 1 year ago COVID-19  ? [facility] [place] Dense M, DO  ? 1 year ago Close exposure to COVID-19 virus  ? [facility] [place] D[place] M, DO  ? 1 year ago Non-seasonal allergic rhinitis due to other allergic trigger  ? [facility], [place], [place]  ? 2 years ago Non-seasonal allergic rhinitis due to other allergic trigger  ? [facility] [place], [place], [place]  ?  ?  ?Future Appointments   ?        ? In 3 weeks Larae Grooms, NP [facility]  ?  ? ?  ?  ?  ? ?

## 2021-08-11 NOTE — Progress Notes (Signed)
? ?BP 120/80 Comment: Home blood pressure reading  Pulse 86   ? ?Subjective:  ? ? Patient ID: Charlene Gomez, female    DOB: November 01, 1990, 31 y.o.   MRN: [id number] ? ?HPI: ?Charlene Gomez is a 31 y.o. female presenting on 08/12/2021 for comprehensive medical examination. Current medical complaints include:none ? ?She currently lives with: ?Menopausal Symptoms: no ? ?Depression Screen done today and results listed below:  ?   ? View : No data to display.  ?  ?  ?  ? ? ?The patient does not have a history of falls. I did complete a risk assessment for falls. A plan of care for falls was documented. ? ? ?Past Medical History:  ?Past Medical History:  ?Diagnosis Date  ? CP (cerebral palsy) (HCC)   ? Developmental delay   ? Premature birth   ? Scoliosis   ? Seizures (HCC)   ? ? ?Surgical History:  ?Past Surgical History:  ?Procedure Laterality Date  ? GASTROSTOMY TUBE CHANGE    ? heart murmur repair    ? HIP SURGERY Left   ? 2 surgeries, metal rod  ? reflux surgery    ? ? ?Medications:  ?Current Outpatient Medications on File Prior to Visit  ?Medication Sig  ? carbamazepine (CARBATROL) 200 MG 12 hr capsule Take 1 capsule by mouth 2 (two) times daily.  ? diazepam (DIASTAT) 2.5 MG GEL Place 2.5 mg rectally once.   ? diazepam (VALIUM) 5 MG tablet Take 5 mg by mouth every 6 (six) hours as needed for anxiety.  ? famotidine (PEPCID) 40 MG/5ML suspension Take 5 mLs (40 mg total) by mouth daily.  ? fexofenadine (ALLEGRA) 30 MG/5ML suspension Take 30 mg by mouth daily.  ? LINZESS 72 MCG capsule TAKE 1 CAPSULE BY MOUTH DAILY BEFORE BREAKFAST.  ? ?No current facility-administered medications on file prior to visit.  ? ? ?Allergies:  ?Allergies  ?Allergen Reactions  ? Cefprozil Other (See Comments)  ? ? ?Social History:  ?Social History  ? ?Socioeconomic History  ? Marital status: Single  ?  Spouse name: Not on file  ? Number of children: Not on file  ? Years of education: Not on file  ? Highest education level: Not on file   ?Occupational History  ? Not on file  ?Tobacco Use  ? Smoking status: Never  ? Smokeless tobacco: Never  ?Vaping Use  ? Vaping Use: Never used  ?Substance and Sexual Activity  ? Alcohol use: No  ? Drug use: No  ? Sexual activity: Never  ?Other Topics Concern  ? Not on file  ?Social History Narrative  ? Not on file  ? ?Social Determinants of Health  ? ?Financial Resource Strain: Not on file  ?Food Insecurity: Not on file  ?Transportation Needs: Not on file  ?Physical Activity: Not on file  ?Stress: Not on file  ?Social Connections: Not on file  ?Intimate Partner Violence: Not on file  ? ?Social History  ? ?Tobacco Use  ?Smoking Status Never  ?Smokeless Tobacco Never  ? ?Social History  ? ?Substance and Sexual Activity  ?Alcohol Use No  ? ? ?Family History:  ?Family History  ?Problem Relation Age of Onset  ? COPD Mother   ? Diabetes Maternal Grandmother   ? Heart disease Maternal Grandmother   ? Cancer Neg Hx   ? Hypertension Neg Hx   ? Stroke Neg Hx   ? ? ?Past medical history, surgical history, medications, allergies, family history and social history  reviewed with patient today and changes made to appropriate areas of the chart.  ? ?Review of Systems  ?Reason unable to perform ROS: Patient does not talk.  ?All other ROS negative except what is listed above and in the HPI.  ? ?   ?Objective:  ?  ?BP 120/80 Comment: Home blood pressure reading  Pulse 86   ?Wt Readings from Last 3 Encounters:  ?05/23/20 100 lb (45.4 kg)  ?  ?Physical Exam ?Vitals and nursing note reviewed.  ?Constitutional:   ?   General: She is awake. She is not in acute distress. ?   Appearance: Normal appearance. She is underweight. She is not ill-appearing.  ?HENT:  ?   Head: Normocephalic and atraumatic.  ?   Right Ear: Hearing, tympanic membrane, ear canal and external ear normal. No drainage.  ?   Left Ear: Hearing, tympanic membrane, ear canal and external ear normal. No drainage.  ?   Nose: Nose normal.  ?   Right Sinus: No maxillary sinus  tenderness or frontal sinus tenderness.  ?   Left Sinus: No maxillary sinus tenderness or frontal sinus tenderness.  ?   Mouth/Throat:  ?   Mouth: Mucous membranes are moist.  ?   Pharynx: Oropharynx is clear. Uvula midline. No pharyngeal swelling, oropharyngeal exudate or posterior oropharyngeal erythema.  ?Eyes:  ?   General: Lids are normal.     ?   Right eye: No discharge.     ?   Left eye: No discharge.  ?   Extraocular Movements: Extraocular movements intact.  ?   Conjunctiva/sclera: Conjunctivae normal.  ?   Pupils: Pupils are equal, round, and reactive to light.  ?   Visual Fields: Right eye visual fields normal and left eye visual fields normal.  ?Neck:  ?   Thyroid: No thyromegaly.  ?   Vascular: No carotid bruit.  ?   Trachea: Trachea normal.  ?Cardiovascular:  ?   Rate and Rhythm: Normal rate and regular rhythm.  ?   Heart sounds: Normal heart sounds. No murmur heard. ?  No gallop.  ?Pulmonary:  ?   Effort: Pulmonary effort is normal. No accessory muscle usage or respiratory distress.  ?   Breath sounds: Wheezing present.  ?Chest:  ?Breasts: ?   Right: Normal.  ?   Left: Normal.  ?Abdominal:  ?   General: Bowel sounds are normal.  ?   Palpations: Abdomen is soft. There is no hepatomegaly or splenomegaly.  ?   Tenderness: There is no abdominal tenderness.  ?Musculoskeletal:  ?   Right wrist: Deformity (contractures) present.  ?   Left wrist: Deformity (contractures) present.  ?   Right hand: Deformity (contractures) present.  ?   Left hand: Deformity (contractures) present.  ?   Cervical back: Normal range of motion and neck supple.  ?   Right lower leg: No edema.  ?   Left lower leg: No edema.  ?Lymphadenopathy:  ?   Head:  ?   Right side of head: No submental, submandibular, tonsillar, preauricular or posterior auricular adenopathy.  ?   Left side of head: No submental, submandibular, tonsillar, preauricular or posterior auricular adenopathy.  ?   Cervical: No cervical adenopathy.  ?   Upper Body:  ?    Right upper body: No supraclavicular, axillary or pectoral adenopathy.  ?   Left upper body: No supraclavicular, axillary or pectoral adenopathy.  ?Skin: ?   General: Skin is warm and dry.  ?  Capillary Refill: Capillary refill takes less than 2 seconds.  ?   Findings: No rash.  ?Neurological:  ?   Mental Status: She is alert and oriented to person, place, and time.  ?   Gait: Gait is intact.  ?   Deep Tendon Reflexes: Reflexes are normal and symmetric.  ?   Reflex Scores: ?     Brachioradialis reflexes are 2+ on the right side and 2+ on the left side. ?     Patellar reflexes are 2+ on the right side and 2+ on the left side. ?Psychiatric:     ?   Attention and Perception: Attention normal.     ?   Mood and Affect: Mood normal.     ?   Speech: Speech normal.     ?   Behavior: Behavior normal. Behavior is cooperative.     ?   Thought Content: Thought content normal.     ?   Judgment: Judgment normal.  ? ? ?Results for orders placed or performed during the hospital encounter of 07/12/15  ?CBC with Differential/Platelet  ?Result Value Ref Range  ? WBC 4.2 3.6 - 11.0 K/uL  ? RBC 5.06 3.80 - 5.20 MIL/uL  ? Hemoglobin 15.3 12.0 - 16.0 g/dL  ? HCT 45.2 35.0 - 47.0 %  ? MCV 89.4 80.0 - 100.0 fL  ? MCH 30.2 26.0 - 34.0 pg  ? MCHC 33.8 32.0 - 36.0 g/dL  ? RDW 13.5 11.5 - 14.5 %  ? Platelets 273 150 - 440 K/uL  ? Neutrophils Relative % 60 %  ? Neutro Abs 2.6 1.4 - 6.5 K/uL  ? Lymphocytes Relative 28 %  ? Lymphs Abs 1.2 1.0 - 3.6 K/uL  ? Monocytes Relative 8 %  ? Monocytes Absolute 0.3 0.2 - 0.9 K/uL  ? Eosinophils Relative 3 %  ? Eosinophils Absolute 0.1 0 - 0.7 K/uL  ? Basophils Relative 1 %  ? Basophils Absolute 0.0 0 - 0.1 K/uL  ?Carbamazepine level, total  ?Result Value Ref Range  ? Carbamazepine Lvl 7.1 4.0 - 12.0 ug/mL  ? ?   ?Assessment & Plan:  ? ?Problem List Items Addressed This Visit   ? ?  ? Nervous and Auditory  ? CP (cerebral palsy) (HCC)  ?  Chronic. Controlled on current medication regimen.  Follow up in 1 year  for reevaluation.  ? ?  ?  ?  ? Other  ? Seizure (HCC)  ?  Chronic. Controlled on current medication regimen.  Followed by Dr. Malvin Johns.  Follow up in 1 year for reevaluation.  ?  ?  ? Gastrostomy tube depen

## 2021-08-12 ENCOUNTER — Encounter: Payer: Self-pay | Admitting: Nurse Practitioner

## 2021-08-12 ENCOUNTER — Ambulatory Visit (INDEPENDENT_AMBULATORY_CARE_PROVIDER_SITE_OTHER): Payer: Medicaid Other | Admitting: Nurse Practitioner

## 2021-08-12 VITALS — BP 120/80 | HR 86

## 2021-08-12 DIAGNOSIS — Z0000 Encounter for general adult medical examination without abnormal findings: Secondary | ICD-10-CM

## 2021-08-12 DIAGNOSIS — R569 Unspecified convulsions: Secondary | ICD-10-CM | POA: Diagnosis not present

## 2021-08-12 DIAGNOSIS — Z931 Gastrostomy status: Secondary | ICD-10-CM | POA: Diagnosis not present

## 2021-08-12 DIAGNOSIS — G809 Cerebral palsy, unspecified: Secondary | ICD-10-CM | POA: Diagnosis not present

## 2021-08-12 DIAGNOSIS — K5900 Constipation, unspecified: Secondary | ICD-10-CM

## 2021-08-12 NOTE — Assessment & Plan Note (Signed)
Chronic. Controlled on current medication regimen.  Followed by Dr. Malvin Johns.  Follow up in 1 year for reevaluation.  ?

## 2021-08-12 NOTE — Assessment & Plan Note (Signed)
Chronic. Controlled. Followed by Dr. Tobi Bastos.   ?

## 2021-08-12 NOTE — Assessment & Plan Note (Signed)
Chronic. Controlled on current medication regimen.  Follow up in 1 year for reevaluation.  ?

## 2021-08-13 LAB — LIPID PANEL
Chol/HDL Ratio: 3.1 ratio (ref 0.0–4.4)
Cholesterol, Total: 199 mg/dL (ref 100–199)
HDL: 64 mg/dL (ref >39)
LDL Chol Calc (NIH): 110 mg/dL — ABNORMAL HIGH (ref 0–99)
Triglycerides: 141 mg/dL (ref 0–149)
VLDL Cholesterol Cal: 25 mg/dL (ref 5–40)

## 2021-08-13 LAB — COMPREHENSIVE METABOLIC PANEL
ALT: 22 IU/L (ref 0–32)
AST: 23 IU/L (ref 0–40)
Albumin/Globulin Ratio: 1.7 (ref 1.2–2.2)
Albumin: 4.7 g/dL (ref 3.8–4.8)
Alkaline Phosphatase: 130 IU/L — ABNORMAL HIGH (ref 44–121)
BUN/Creatinine Ratio: 17 (ref 9–23)
BUN: 12 mg/dL (ref 6–20)
Bilirubin Total: 0.3 mg/dL (ref 0.0–1.2)
CO2: 22 mmol/L (ref 20–29)
Calcium: 9.6 mg/dL (ref 8.7–10.2)
Chloride: 105 mmol/L (ref 96–106)
Creatinine, Ser: 0.72 mg/dL (ref 0.57–1.00)
Globulin, Total: 2.7 g/dL (ref 1.5–4.5)
Glucose: 83 mg/dL (ref 70–99)
Potassium: 4.6 mmol/L (ref 3.5–5.2)
Sodium: 140 mmol/L (ref 134–144)
Total Protein: 7.4 g/dL (ref 6.0–8.5)
eGFR: 114 mL/min/{1.73_m2} (ref >59)

## 2021-08-13 LAB — CBC WITH DIFFERENTIAL/PLATELET
Basophils Absolute: 0 10*3/uL (ref 0.0–0.2)
Basos: 1 %
EOS (ABSOLUTE): 0.2 10*3/uL (ref 0.0–0.4)
Eos: 4 %
Hematocrit: 47.3 % — ABNORMAL HIGH (ref 34.0–46.6)
Hemoglobin: 16.1 g/dL — ABNORMAL HIGH (ref 11.1–15.9)
Immature Grans (Abs): 0 10*3/uL (ref 0.0–0.1)
Immature Granulocytes: 0 %
Lymphocytes Absolute: 1.4 10*3/uL (ref 0.7–3.1)
Lymphs: 28 %
MCH: 30.7 pg (ref 26.6–33.0)
MCHC: 34 g/dL (ref 31.5–35.7)
MCV: 90 fL (ref 79–97)
Monocytes Absolute: 0.5 10*3/uL (ref 0.1–0.9)
Monocytes: 9 %
Neutrophils Absolute: 2.8 10*3/uL (ref 1.4–7.0)
Neutrophils: 58 %
Platelets: 190 10*3/uL (ref 150–450)
RBC: 5.24 x10E6/uL (ref 3.77–5.28)
RDW: 12 % (ref 11.7–15.4)
WBC: 4.9 10*3/uL (ref 3.4–10.8)

## 2021-08-13 LAB — TSH: TSH: 2.5 u[IU]/mL (ref 0.450–4.500)

## 2021-08-13 NOTE — Progress Notes (Signed)
Please let patient's parents know that her lab work looks good.  We will continue to monitor in the future.  No concerns at this time.  Follow up as discussed.

## 2021-10-17 ENCOUNTER — Telehealth: Payer: Self-pay

## 2021-10-17 NOTE — Telephone Encounter (Signed)
I don't think Clydie Braun manages stomach tubes.

## 2021-10-17 NOTE — Telephone Encounter (Unsigned)
Copied from CRM [id number]. Topic: General - Other >> Oct 17, 2021 12:42 PM Ja-Kwan M wrote: Reason for CRM: Pt mother requests that a new Rx for stomach tube and Ensure Plus be sent to [facility] [phone]. Pt mother stated she has not been able to reach the GI doctor and the pt has not seen them in a long time.

## 2021-10-21 NOTE — Telephone Encounter (Signed)
Spoke with representative from [facility], states they need a written RX for Ensures and a written RX for GI tubes 14 Fr 2.0 was what patient has been receiving for tubes and attached pt's last office note. For the ensures you can either write how many cans per days or how many calories needed for patient along with what they will use whether it be a syringe or a pump. Representative states pt has been using syringe.   Fax #: [phone]

## 2021-11-01 ENCOUNTER — Telehealth: Payer: Self-pay

## 2021-11-01 NOTE — Telephone Encounter (Signed)
G tubes are not changed every month if needing a change everymonth then something not being done right - how often are they changing it and why

## 2021-11-01 NOTE — Telephone Encounter (Signed)
Patient's mother-Karen called stating that her daughter continues to have a G tube and she is requesting a new prescription for [facility] to supply her with a new G tube every month. Clydie Braun stated that her daughter has cerebral palsy and seizures very often and when she does her G tube busts open every time. She stated that [facility] had told her that Medicaid will only pay for three Gtubes in a year but if she gets a letter from the doctor stating that she needs one every month, then they will try to get them for her. Please advise as what you are recommending for patient.  The only appointment she had with you was on 01/28/2017.

## 2021-11-04 NOTE — Telephone Encounter (Signed)
Patient's mother states a GI doctor told her that her PCP must be the one to do. Patient is to see GI Aug 1. 2023. I have advised patient you typically do not rx this due to this is something GI needs to do. Pt's mom is asking to write script until they see GI. Please advise.

## 2021-11-04 NOTE — Telephone Encounter (Signed)
Yes we can write the prescription since she has the appt.  Please write on prescription pad like we did for the last one.

## 2021-11-04 NOTE — Telephone Encounter (Signed)
Called patient's mother-Karen to let her know what Dr. Tobi Bastos stated and she was okay with the response. However, she stated that she wanted her daughter to be seen by Dr. Tobi Bastos since it had been a long time ago that her daughter had seen Dr. Tobi Bastos. Patient will see Dr. Tobi Bastos on 11/26/2021 at 3:00 PM.

## 2021-11-04 NOTE — Telephone Encounter (Addendum)
Pt mother stated that pt needs a feeding tube every month. Pt mother stated Medicaid only gives her 3 a year. Stated that her daughter has cerebral palsy and seizures, which is why she needs a new one every month.  Pt mother is asking for an Rx to be sent to [facility], and on the Rx, she says that she needs one every month so they can give it to her.  Pt mother stated one is needed now urgently.    Please advise.

## 2021-11-04 NOTE — Telephone Encounter (Signed)
Patient will need to see GI.  I did this as a favor last time but further prescriptions will need to come from GI.

## 2021-11-04 NOTE — Telephone Encounter (Signed)
The patient's mother stated that her daughter has frequent seizures and she tends to bust it every time. Therefore, her mother wanted to know if you could write an order for a G tube just in case she needs one. If not, [facility] will not provide it. Her mother stated that [facility] changes the GTube depending on her daughter and how often she has seizures.

## 2021-11-05 NOTE — Telephone Encounter (Signed)
RX faxed to [facility] at [phone]

## 2021-11-11 ENCOUNTER — Telehealth: Payer: Self-pay

## 2021-11-11 NOTE — Telephone Encounter (Signed)
Attempted to call [facility] regarding more information about feeding tube orders and to inquire if patient has seen a nutritionist. I have been on hold for 15 minutes, no answer. Will try again.

## 2021-11-12 NOTE — Telephone Encounter (Signed)
Mother is on the phone having issues with the insurance about getting [facility] to get her a new tubes because the insurance no longer takes care of it. Patient needs one every month. Due to scoliosis body movement and seizures the tubes don't last. Can you please assist patient

## 2021-11-13 ENCOUNTER — Telehealth: Payer: Self-pay

## 2021-11-13 NOTE — Telephone Encounter (Signed)
RX for G Tube and feeding orders have been faxed to [facility] as well with last office visit.

## 2021-11-13 NOTE — Telephone Encounter (Signed)
Spoke with [facility] representative named Almira Coaster. States feeding amount was missing on the last rx we sent over. I stated to Almira Coaster we had already sent that information over previously but happy to rewrite that rx for patient. Almira Coaster also states last office visit was missing in order to process Gtube orders. I have also gotten that rx ready for provider to sign as well. Almira Coaster did advise that she was not able to see where patient has seen nutritionist.

## 2021-11-13 NOTE — Telephone Encounter (Signed)
Form signed.

## 2021-11-14 ENCOUNTER — Telehealth: Payer: Self-pay

## 2021-11-14 NOTE — Telephone Encounter (Signed)
Spoke with [facility]'s dietician and explained that I asked for an evaluation over a month ago due to her not receiving enough nutrition. Dietician did not know about this order.  Discussed that I am aware she is not receiving enough and needs new orders.  Dietician states she has what she needs to do the evaluation and will send orders to be signed.

## 2021-11-14 NOTE — Telephone Encounter (Signed)
Noted. Will await orders.

## 2021-11-14 NOTE — Telephone Encounter (Signed)
[facility] faxed over documentation stating they are unable to continue processing the order d/t insurance not covering 1 carton per day of Ensure via g-tube as this does not meet 75% of daily caloric intake. [facility] is requesting new orders that include more than 1 carton per day.

## 2021-11-14 NOTE — Telephone Encounter (Signed)
I have already asked for a nutrition referral for patient due to this not being enough calories.  [facility] was supposed to complete this.  If they are not able to complete the task I will need to place a nutrition referral for an outside company.

## 2021-11-15 NOTE — Telephone Encounter (Signed)
Ready for you to sign

## 2021-11-18 NOTE — Telephone Encounter (Signed)
Form signed for patient. 

## 2021-11-18 NOTE — Telephone Encounter (Signed)
Forms have been faxed to [facility].

## 2021-11-26 ENCOUNTER — Ambulatory Visit: Payer: Medicaid Other | Admitting: Gastroenterology

## 2021-11-26 NOTE — Progress Notes (Deleted)
Wyline Mood MD, MRCP(U.K) [address]  [address]  [place], [place] [address]  Main: [phone]  Fax: [phone]   Gastroenterology Consultation  Referring Provider:     Larae Grooms, NP Primary Care Physician:  Larae Grooms, NP Primary Gastroenterologist:  Dr. Wyline Mood  Reason for Consultation:     G-tube management        HPI:   Charlene Gomez is a 31 y.o. y/o female referred for consultation & management  by Larae Grooms, NP.     She was last seen in my office back in October 2018.  The patient has had a G-tube for over 31 years.  She called in a few weeks back to have the G-tube evaluated and hence is here today.  Past Medical History:  Diagnosis Date   CP (cerebral palsy) (HCC)    Developmental delay    Premature birth    Scoliosis    Seizures (HCC)     Past Surgical History:  Procedure Laterality Date   GASTROSTOMY TUBE CHANGE     heart murmur repair     HIP SURGERY Left    2 surgeries, metal rod   reflux surgery      Prior to Admission medications   Medication Sig Start Date End Date Taking? Authorizing Provider  carbamazepine (CARBATROL) 200 MG 12 hr capsule Take 1 capsule by mouth 2 (two) times daily. 01/22/21   [provider]  diazepam (DIASTAT) 2.5 MG GEL Place 2.5 mg rectally once.     [provider]  diazepam (VALIUM) 5 MG tablet Take 5 mg by mouth every 6 (six) hours as needed for anxiety.    [provider]  famotidine (PEPCID) 40 MG/5ML suspension Take 5 mLs (40 mg total) by mouth daily. 07/13/20   Larae Grooms, NP  fexofenadine [facility]) 30 MG/5ML suspension Take 30 mg by mouth daily.    [provider]  LINZESS 72 MCG capsule TAKE 1 CAPSULE BY MOUTH DAILY BEFORE BREAKFAST. 07/16/21   Larae Grooms, NP    Family History  Problem Relation Age of Onset   COPD Mother    Diabetes Maternal Grandmother    Heart disease Maternal Grandmother    Cancer Neg Hx    Hypertension Neg  Hx    Stroke Neg Hx      Social History   Tobacco Use   Smoking status: Never   Smokeless tobacco: Never  Vaping Use   Vaping Use: Never used  Substance Use Topics   Alcohol use: No   Drug use: No    Allergies as of 11/26/2021 - Review Complete 08/12/2021  Allergen Reaction Noted   Cefprozil Other (See Comments)     Review of Systems:    All systems reviewed and negative except where noted in HPI.   Physical Exam:  There were no vitals taken for this visit. No LMP recorded. Psych:  Alert and cooperative. Normal mood and affect. General:   Alert,  Well-developed, well-nourished, pleasant and cooperative in NAD Head:  Normocephalic and atraumatic. Eyes:  Sclera clear, no icterus.   Conjunctiva pink. Ears:  Normal auditory acuity. Neck:  Supple; no masses or thyromegaly. Lungs:  Respirations even and unlabored.  Clear throughout to auscultation.   No wheezes, crackles, or rhonchi. No acute distress. Heart:  Regular rate and rhythm; no murmurs, clicks, rubs, or gallops. Abdomen:  Normal bowel sounds.  No bruits.  Soft, non-tender and non-distended without masses, hepatosplenomegaly or hernias noted.  No  guarding or rebound tenderness.    Neurologic:  Alert and oriented x3;  grossly normal neurologically. Psych:  Alert and cooperative. Normal mood and affect.  Imaging Studies: No results found.  Assessment and Plan:   TANETTE CHAUCA is a 31 y.o. y/o female is here today to have a G-tube evaluated which she has had for over 30 years.  Follow up in ***  Dr Wyline Mood MD,MRCP(U.K)

## 2022-02-21 ENCOUNTER — Telehealth: Payer: Self-pay

## 2022-02-21 NOTE — Telephone Encounter (Signed)
Mother is requesting an appointment soon as pt is having some complications... Mother Charlene Gomez says that she missed the previous appt due to her having covid  Can you please call mother back to fit pt in?

## 2022-02-24 ENCOUNTER — Telehealth: Payer: Self-pay

## 2022-02-24 ENCOUNTER — Ambulatory Visit (INDEPENDENT_AMBULATORY_CARE_PROVIDER_SITE_OTHER): Payer: Medicaid Other | Admitting: Gastroenterology

## 2022-02-24 VITALS — BP 143/111 | HR 96 | Wt 79.0 lb

## 2022-02-24 DIAGNOSIS — K219 Gastro-esophageal reflux disease without esophagitis: Secondary | ICD-10-CM | POA: Diagnosis not present

## 2022-02-24 DIAGNOSIS — Z931 Gastrostomy status: Secondary | ICD-10-CM

## 2022-02-24 MED ORDER — OMEPRAZOLE 2 MG/ML ORAL SUSPENSION: 40.0000 mg | Freq: Two times a day (BID) | ORAL | 2 refills | Status: DC

## 2022-02-24 MED ORDER — OMEPRAZOLE 40 MG PO CPDR: 40.0000 mg | DELAYED_RELEASE_CAPSULE | Freq: Two times a day (BID) | ORAL | 3 refills | Status: DC

## 2022-02-24 MED ORDER — NYSTATIN 100000 UNIT/GM EX CREA: 1.0000 | TOPICAL_CREAM | Freq: Two times a day (BID) | CUTANEOUS | 0 refills | Status: AC

## 2022-02-24 NOTE — Progress Notes (Signed)
Wyline Mood MD, MRCP(U.K) [address]  [address]  [place], [place] [address]  Main: [phone]  Fax: [phone]   Gastroenterology Consultation  Referring Provider:     Larae Grooms, NP Primary Care Physician:  Larae Grooms, NP Primary Gastroenterologist:  Dr. Wyline Mood  Reason for Consultation:    G tube issues         HPI:   Charlene Gomez is a 31 y.o. y/o female referred for consultation & management  by Larae Grooms, NP.     Last visitedour office back in 2018. Had a G tube for the past  31 years.  She is here with her family because she requires an order for the G-tube to be changed every month rather than every 3 months due to the scoliosis the patient has and seizures in the balloon directions due to motion and movement.  Also requests a refill on her omeprazole which she takes once a day and develops yeast infection on the skin due to seepage of the feet and would require nystatin topical.  Past Medical History:  Diagnosis Date   CP (cerebral palsy) (HCC)    Developmental delay    Premature birth    Scoliosis    Seizures (HCC)     Past Surgical History:  Procedure Laterality Date   GASTROSTOMY TUBE CHANGE     heart murmur repair     HIP SURGERY Left    2 surgeries, metal rod   reflux surgery      Prior to Admission medications   Medication Sig Start Date End Date Taking? Authorizing Provider  carbamazepine (CARBATROL) 200 MG 12 hr capsule Take 1 capsule by mouth 2 (two) times daily. 01/22/21   [provider]  diazepam (DIASTAT) 2.5 MG GEL Place 2.5 mg rectally once.     [provider]  diazepam (VALIUM) 5 MG tablet Take 5 mg by mouth every 6 (six) hours as needed for anxiety.    [provider]  famotidine (PEPCID) 40 MG/5ML suspension Take 5 mLs (40 mg total) by mouth daily. 07/13/20   Larae Grooms, NP  fexofenadine [facility]) 30 MG/5ML suspension Take 30 mg by mouth daily.    [provider]  LINZESS 72 MCG capsule TAKE 1 CAPSULE BY MOUTH DAILY BEFORE BREAKFAST. 07/16/21   Larae Grooms, NP    Family History  Problem Relation Age of Onset   COPD Mother    Diabetes Maternal Grandmother    Heart disease Maternal Grandmother    Cancer Neg Hx    Hypertension Neg Hx    Stroke Neg Hx      Social History   Tobacco Use   Smoking status: Never   Smokeless tobacco: Never  Vaping Use   Vaping Use: Never used  Substance Use Topics   Alcohol use: No   Drug use: No    Allergies as of 02/24/2022 - Review Complete 08/12/2021  Allergen Reaction Noted   Cefprozil Other (See Comments)     Review of Systems:    All systems reviewed and negative except where noted in HPI.   Physical Exam:  There were no vitals taken for this visit. No LMP recorded. Psych: Awake, baseline cerebral palsy unable to communicate effectively General: In the wheelchair Neck:  Supple; no masses or thyromegaly. Abdomen: G-tube placement noted skin around the G-tube area erythematous apparently it was larger lately which is improved with nystatin topical Neurologic:  Alert and oriented xo   Imaging  Studies: No results found.  Assessment and Plan:   Charlene Gomez is a 31 y.o. y/o female with a G-tube for over 31 years history of cerebral palsy.  Here for orders for G-tube replacement, PPI refill and nystatin topical   Plan 1.  Topical nystatin prescribed to be applied around the G-tube area as needed twice a day 2.  G-tube replacement every month.  Patient requires this more often due to repeated seizures and scoliosis leading to fracture of the balloon due to movement 3.  Omeprazole 40 mg twice daily Follow up in as needed  Dr Jonathon Bellows MD,MRCP(U.K)

## 2022-02-24 NOTE — Telephone Encounter (Signed)
Order faxed to [facility] to change G-Tube every month due to scoliosis.  Fax # [phone] Office # [phone]

## 2022-02-24 NOTE — Telephone Encounter (Signed)
Made appointment for today 1:45pm

## 2022-02-28 MED ORDER — OMEPRAZOLE 40 MG PO CPDR: 40.0000 mg | DELAYED_RELEASE_CAPSULE | Freq: Two times a day (BID) | ORAL | 3 refills | Status: DC

## 2022-02-28 NOTE — Addendum Note (Signed)
Addended by: Wayna Chalet on: 02/28/2022 09:47 AM   Modules accepted: Orders

## 2022-05-27 ENCOUNTER — Telehealth (INDEPENDENT_AMBULATORY_CARE_PROVIDER_SITE_OTHER): Payer: Medicaid Other | Admitting: Nurse Practitioner

## 2022-05-27 ENCOUNTER — Encounter: Payer: Self-pay | Admitting: Nurse Practitioner

## 2022-05-27 DIAGNOSIS — G809 Cerebral palsy, unspecified: Secondary | ICD-10-CM

## 2022-05-27 NOTE — Progress Notes (Signed)
There were no vitals taken for this visit.   Subjective:    Patient ID: Charlene Gomez, female    DOB: June 27, 1990, 32 y.o.   MRN: [id number]  HPI: Charlene Gomez is a 32 y.o. female  Chief Complaint  Patient presents with   DME    Pt's mother stated that the patient is in need to a bath chair for the patient.    Patient's mom states she needs a new bath chair.  The one she was previously using broke.  Mom denies other concerns at visit today.    [address] [place] [phone]   Relevant past medical, surgical, family and social history reviewed and updated as indicated. Interim medical history since our last visit reviewed. Allergies and medications reviewed and updated.  Review of Systems  All other systems reviewed and are negative.   Per HPI unless specifically indicated above     Objective:    There were no vitals taken for this visit.  Wt Readings from Last 3 Encounters:  02/24/22 79 lb (35.8 kg)  05/23/20 100 lb (45.4 kg)    Physical Exam Vitals and nursing note reviewed.  HENT:     Head: Normocephalic.     Right Ear: Hearing normal.     Left Ear: Hearing normal.     Nose: Nose normal.  Eyes:     Pupils: Pupils are equal, round, and reactive to light.  Pulmonary:     Effort: Pulmonary effort is normal. No respiratory distress.  Neurological:     Mental Status: She is alert.  Psychiatric:        Mood and Affect: Mood normal.        Behavior: Behavior normal.        Thought Content: Thought content normal.        Judgment: Judgment normal.     Results for orders placed or performed in visit on 08/12/21  CBC with Differential/Platelet  Result Value Ref Range   WBC 4.9 3.4 - 10.8 x10E3/uL   RBC 5.24 3.77 - 5.28 x10E6/uL   Hemoglobin 16.1 (H) 11.1 - 15.9 g/dL   Hematocrit 47.3 (H) 34.0 - 46.6 %   MCV 90 79 - 97 fL   MCH 30.7 26.6 - 33.0 pg   MCHC 34.0 31.5 - 35.7 g/dL   RDW 12.0 11.7 - 15.4 %   Platelets 190 150 - 450 x10E3/uL   Neutrophils  58 Not Estab. %   Lymphs 28 Not Estab. %   Monocytes 9 Not Estab. %   Eos 4 Not Estab. %   Basos 1 Not Estab. %   Neutrophils Absolute 2.8 1.4 - 7.0 x10E3/uL   Lymphocytes Absolute 1.4 0.7 - 3.1 x10E3/uL   Monocytes Absolute 0.5 0.1 - 0.9 x10E3/uL   EOS (ABSOLUTE) 0.2 0.0 - 0.4 x10E3/uL   Basophils Absolute 0.0 0.0 - 0.2 x10E3/uL   Immature Granulocytes 0 Not Estab. %   Immature Grans (Abs) 0.0 0.0 - 0.1 x10E3/uL  Comprehensive metabolic panel  Result Value Ref Range   Glucose 83 70 - 99 mg/dL   BUN 12 6 - 20 mg/dL   Creatinine, Ser 0.72 0.57 - 1.00 mg/dL   eGFR 114 >59 mL/min/1.73   BUN/Creatinine Ratio 17 9 - 23   Sodium 140 134 - 144 mmol/L   Potassium 4.6 3.5 - 5.2 mmol/L   Chloride 105 96 - 106 mmol/L   CO2 22 20 - 29 mmol/L   Calcium 9.6 8.7 -  10.2 mg/dL   Total Protein 7.4 6.0 - 8.5 g/dL   Albumin 4.7 3.8 - 4.8 g/dL   Globulin, Total 2.7 1.5 - 4.5 g/dL   Albumin/Globulin Ratio 1.7 1.2 - 2.2   Bilirubin Total 0.3 0.0 - 1.2 mg/dL   Alkaline Phosphatase 130 (H) 44 - 121 IU/L   AST 23 0 - 40 IU/L   ALT 22 0 - 32 IU/L  Lipid panel  Result Value Ref Range   Cholesterol, Total 199 100 - 199 mg/dL   Triglycerides 141 0 - 149 mg/dL   HDL 64 >39 mg/dL   VLDL Cholesterol Cal 25 5 - 40 mg/dL   LDL Chol Calc (NIH) 110 (H) 0 - 99 mg/dL   Chol/HDL Ratio 3.1 0.0 - 4.4 ratio  TSH  Result Value Ref Range   TSH 2.500 0.450 - 4.500 uIU/mL      Assessment & Plan:   Problem List Items Addressed This Visit       Nervous and Auditory   CP (cerebral palsy) ([facility]) - Primary    DME placed for new bath chair.        Relevant Orders   For home use only DME Other see comment     Follow up plan: Return if symptoms worsen or fail to improve.  This visit was completed via MyChart due to the restrictions of the COVID-19 pandemic. All issues as above were discussed and addressed. Physical exam was done as above through visual confirmation on MyChart. If it was felt that the patient  should be evaluated in the office, they were directed there. The patient verbally consented to this visit. Location of the patient: Home Location of the provider: Office Those involved with this call:  Provider: Jon Billings, NP CMA: Yvonna Alanis, CMA Front Desk/Registration: Lynnell Catalan This encounter was conducted via video.  I spent 20 dedicated to the care of this patient on the date of this encounter to include previsit review of symptoms, plan of care and follow up, face to face time with the patient, and post visit ordering of testing.

## 2022-05-27 NOTE — Assessment & Plan Note (Signed)
DME placed for new bath chair.

## 2022-07-28 ENCOUNTER — Telehealth (INDEPENDENT_AMBULATORY_CARE_PROVIDER_SITE_OTHER): Payer: Medicaid Other | Admitting: Physician Assistant

## 2022-07-28 ENCOUNTER — Ambulatory Visit: Payer: Self-pay | Admitting: *Deleted

## 2022-07-28 DIAGNOSIS — J3089 Other allergic rhinitis: Secondary | ICD-10-CM | POA: Diagnosis not present

## 2022-07-28 MED ORDER — FEXOFENADINE HCL 30 MG/5ML PO SUSP: 30.0000 mg | Freq: Two times a day (BID) | ORAL | 2 refills | Status: DC

## 2022-07-28 MED ORDER — AMOXICILLIN 400 MG/5ML PO SUSR: 400.0000 mg | Freq: Two times a day (BID) | ORAL | 0 refills | Status: AC

## 2022-07-28 NOTE — Telephone Encounter (Signed)
COugh & Congestion Advice   Pts mother is calling to request an antibotic for the patient. No available appts. Pt sx cough & Congestion. Interested in virtual appt. Please advise     Chief Complaint: Sinus pain, pressure Symptoms: Sinus pain, congestion, pressure, dry cough Frequency: 4  days Pertinent Negatives: Patient denies fever, SOB Disposition: [] ED /[] Urgent Care (no appt availability in office) / [x] Appointment(In office/virtual)/ []  [facility]/ [] Home Care/ [] Refused Recommended Disposition /[] [facility]/ []  Follow-up with PCP Additional Notes: Pt's mother calling. States she has same thing "Sinus infection" and is being treated. States pt "Get's theses and they always treat her with amoxicillin and liquid Allegra."  Advised would need appt. No availability at [facility], called practice, [facility], to verify. Secured Virtual Appt (Mother's request) with Junie Panning at [place] today. Care advise provided, verbalizes understanding.   Reason for Disposition  Earache  Answer Assessment - Initial Assessment Questions 1. LOCATION: "Where does it hurt?"      Sinus area 2. ONSET: "When did the sinus pain start?"  (e.g., hours, days)      4 days 3. SEVERITY: "How bad is the pain?"   (Scale 1-10; mild, moderate or severe)   - MILD (1-3): doesn't interfere with normal activities    - MODERATE (4-7): interferes with normal activities (e.g., work or school) or awakens from sleep   - SEVERE (8-10): excruciating pain and patient unable to do any normal activities        Unsure 4. RECURRENT SYMPTOM: "Have you ever had sinus problems before?" If Yes, ask: "When was the last time?" and "What happened that time?"      Yes 5. NASAL CONGESTION: "Is the nose blocked?" If Yes, ask: "Can you open it or must you breathe through your mouth?"      6. NASAL DISCHARGE: "Do you have discharge from your nose?" If so ask, "What color?"     Clearing 7. FEVER: "Do you have a fever?" If Yes, ask: "What is  it, how was it measured, and when did it start?"      no 8. OTHER SYMPTOMS: "Do you have any other symptoms?" (e.g., sore throat, cough, earache, difficulty breathing)     Cough, mild, dry  Protocols used: Sinus Pain or Congestion-A-AH

## 2022-07-28 NOTE — Progress Notes (Signed)
    Virtual Visit via Video Note  I connected with [name] on 07/28/22 at  3:20 PM EDT by a video enabled telemedicine application and verified that I am speaking with the correct person using two identifiers.  Today's Provider: Talitha Givens, MHS, PA-C Introduced myself to the patient as a PA-C and provided education on APPs in clinical practice.   Location: Patient: at home Provider: [place], [place]    I discussed the limitations of evaluation and management by telemedicine and the availability of in person appointments. The patient expressed understanding and agreed to proceed.   Chief Complaint  Patient presents with   Sinusitis    Onset for 4 days, stuffed up a lot of pressure.    History of Present Illness:  Connected with patient and her mother  Her mother is providing HPI as patient is nonverbal   Her mother states she has had some coughing, sinus drainage in nose and throat,  Her mother states she has bad allergies but has not started her Allegra yet   Her mother states she always gets this around this time of the year and usually needs Amoxicillin and Allegra for the symptoms       Review of Systems  Constitutional:  Negative for chills, fever and malaise/fatigue.  HENT:  Positive for congestion and sinus pain.   Respiratory:  Positive for cough. Negative for shortness of breath and wheezing.   Neurological:  Positive for seizures.      Observations/Objective:   Due to the nature of the virtual visit, physical exam and observations are limited. Able to obtain the following observations:   Alert, laying on couch, patient is nonverbal due to CP Appears comfortable, in no acute distress.  No scleral injection or observable signs of distress.  No cough appreciated during visit.     Assessment and Plan:   Problem List Items Addressed This Visit       Respiratory   Allergic rhinitis - Primary Acute, recurrent   Patient's mother provided HPI and PE is limited by virtual visit and patient's chronic disability  Review of previous notes, demonstrates recurrent sinusitis/ rhinitis around this time of year that has been treated with Amoxicillin and Allegra with notable efficacy Will send in scripts for both per mother's request Reviewed OTC medications for further symptom relief Mother is aware of ED precautions Follow up as needed for persistent or progressing symptoms     Relevant Medications   amoxicillin (AMOXIL) 400 MG/5ML suspension   fexofenadine (ALLEGRA) 30 MG/5ML suspension    Follow Up Instructions:    I discussed the assessment and treatment plan with the patient. The patient was provided an opportunity to ask questions and all were answered. The patient agreed with the plan and demonstrated an understanding of the instructions.   The patient was advised to call back or seek an in-person evaluation if the symptoms worsen or if the condition fails to improve as anticipated.  I provided 10 minutes of non-face-to-face time during this encounter.  No follow-ups on file.   I, Najeh Credit E Zyir Gassert, PA-C, have reviewed all documentation for this visit. The documentation on 07/28/22 for the exam, diagnosis, procedures, and orders are all accurate and complete.   Talitha Givens, MHS, PA-C [facility]

## 2022-08-14 ENCOUNTER — Encounter: Payer: Medicaid Other | Admitting: Nurse Practitioner

## 2022-09-10 ENCOUNTER — Telehealth: Payer: Self-pay | Admitting: Nurse Practitioner

## 2022-09-10 NOTE — Telephone Encounter (Signed)
Copied from CRM [id number]. Topic: General - Other >> Sep 10, 2022 11:59 AM Charlene Gomez wrote: Reason for CRM:  patient has an appt  with fasting labs, patient has to take seizure medication with food at 7am Please advise.

## 2022-09-10 NOTE — Telephone Encounter (Signed)
Patient's mother notified of Karen's response.  

## 2022-09-10 NOTE — Telephone Encounter (Signed)
No problem.  She can take her medication.

## 2022-09-12 ENCOUNTER — Encounter: Payer: Medicaid Other | Admitting: Nurse Practitioner

## 2022-09-18 ENCOUNTER — Ambulatory Visit (INDEPENDENT_AMBULATORY_CARE_PROVIDER_SITE_OTHER): Payer: Medicaid Other | Admitting: Nurse Practitioner

## 2022-09-18 ENCOUNTER — Encounter: Payer: Self-pay | Admitting: Nurse Practitioner

## 2022-09-18 VITALS — BP 131/89

## 2022-09-18 DIAGNOSIS — Z0000 Encounter for general adult medical examination without abnormal findings: Secondary | ICD-10-CM | POA: Diagnosis not present

## 2022-09-18 DIAGNOSIS — J3089 Other allergic rhinitis: Secondary | ICD-10-CM

## 2022-09-18 MED ORDER — MUPIROCIN 2 % EX OINT: 1.0000 | TOPICAL_OINTMENT | Freq: Two times a day (BID) | CUTANEOUS | 1 refills | Status: AC

## 2022-09-18 NOTE — Progress Notes (Signed)
BP 131/89   LMP 08/27/2022 (Approximate)    Subjective:    Patient ID: Charlene Gomez, female    DOB: January 26, 1991, 32 y.o.   MRN: [id number]  HPI: Charlene Gomez is a 32 y.o. female presenting on 09/18/2022 for comprehensive medical examination. Current medical complaints include:none  She currently lives with: Menopausal Symptoms: no  Depression Screen done today and results listed below:     07/28/2022    1:45 PM  Depression screen PHQ 2/9  Decreased Interest 0  Down, Depressed, Hopeless 0  PHQ - 2 Score 0  Altered sleeping 0  Tired, decreased energy 0  Change in appetite 0  Feeling bad or failure about yourself  0  Trouble concentrating 0  Moving slowly or fidgety/restless 0  Suicidal thoughts 0  PHQ-9 Score 0  Difficult doing work/chores Not difficult at all    The patient does not have a history of falls. I did complete a risk assessment for falls. A plan of care for falls was documented.   Past Medical History:  Past Medical History:  Diagnosis Date   CP (cerebral palsy) (HCC)    Developmental delay    Premature birth    Scoliosis    Seizures (HCC)     Surgical History:  Past Surgical History:  Procedure Laterality Date   GASTROSTOMY TUBE CHANGE     heart murmur repair     HIP SURGERY Left    2 surgeries, metal rod   reflux surgery      Medications:  Current Outpatient Medications on File Prior to Visit  Medication Sig   carbamazepine (CARBATROL) 200 MG 12 hr capsule Take by mouth. Take 1 capsule in the AM and 2 capsules in the PM   diazepam (DIASTAT) 2.5 MG GEL Place 2.5 mg rectally once.    diazepam (VALIUM) 5 MG tablet Take 5 mg by mouth every 6 (six) hours as needed for anxiety.   fexofenadine (ALLEGRA) 30 MG/5ML suspension Take 5 mLs (30 mg total) by mouth in the morning and at bedtime.   LINZESS 72 MCG capsule TAKE 1 CAPSULE BY MOUTH DAILY BEFORE BREAKFAST.   nystatin cream (MYCOSTATIN) Apply 1 Application topically 2 (two) times daily.    omeprazole (PRILOSEC) 40 MG capsule Take 1 capsule (40 mg total) by mouth in the morning and at bedtime.   No current facility-administered medications on file prior to visit.    Allergies:  Allergies  Allergen Reactions   Cefprozil Other (See Comments)    Social History:  Social History   Socioeconomic History   Marital status: Single    Spouse name: Not on file   Number of children: Not on file   Years of education: Not on file   Highest education level: Not on file  Occupational History   Not on file  Tobacco Use   Smoking status: Never   Smokeless tobacco: Never  Vaping Use   Vaping Use: Never used  Substance and Sexual Activity   Alcohol use: No   Drug use: No   Sexual activity: Never  Other Topics Concern   Not on file  Social History Narrative   Not on file   Social Determinants of Health   Financial Resource Strain: Not on file  Food Insecurity: Not on file  Transportation Needs: Not on file  Physical Activity: Not on file  Stress: Not on file  Social Connections: Not on file  Intimate Partner Violence: Not on file   Social History  Tobacco Use  Smoking Status Never  Smokeless Tobacco Never   Social History   Substance and Sexual Activity  Alcohol Use No    Family History:  Family History  Problem Relation Age of Onset   COPD Mother    Diabetes Maternal Grandmother    Heart disease Maternal Grandmother    Cancer Neg Hx    Hypertension Neg Hx    Stroke Neg Hx     Past medical history, surgical history, medications, allergies, family history and social history reviewed with patient today and changes made to appropriate areas of the chart.   Review of Systems  Reason unable to perform ROS: Patient does not talk.  All other systems reviewed and are negative.  All other ROS negative except what is listed above and in the HPI.      Objective:    BP 131/89   LMP 08/27/2022 (Approximate)   Wt Readings from Last 3 Encounters:   02/24/22 79 lb (35.8 kg)  05/23/20 100 lb (45.4 kg)    Physical Exam Vitals and nursing note reviewed.  Constitutional:      General: She is awake. She is not in acute distress.    Appearance: Normal appearance. She is underweight. She is not ill-appearing.  HENT:     Head: Normocephalic and atraumatic.     Right Ear: Hearing, tympanic membrane, ear canal and external ear normal. No drainage.     Left Ear: Hearing, tympanic membrane, ear canal and external ear normal. No drainage.     Nose: Nose normal.     Right Sinus: No maxillary sinus tenderness or frontal sinus tenderness.     Left Sinus: No maxillary sinus tenderness or frontal sinus tenderness.     Mouth/Throat:     Mouth: Mucous membranes are moist.     Pharynx: Oropharynx is clear. Uvula midline. No pharyngeal swelling, oropharyngeal exudate or posterior oropharyngeal erythema.  Eyes:     General: Lids are normal.        Right eye: No discharge.        Left eye: No discharge.     Extraocular Movements: Extraocular movements intact.     Conjunctiva/sclera: Conjunctivae normal.     Pupils: Pupils are equal, round, and reactive to light.     Visual Fields: Right eye visual fields normal and left eye visual fields normal.  Neck:     Thyroid: No thyromegaly.     Vascular: No carotid bruit.     Trachea: Trachea normal.  Cardiovascular:     Rate and Rhythm: Normal rate and regular rhythm.     Heart sounds: Normal heart sounds. No murmur heard.    No gallop.  Pulmonary:     Effort: Pulmonary effort is normal. No accessory muscle usage or respiratory distress.     Breath sounds: Wheezing present.  Chest:  Breasts:    Right: Normal.     Left: Normal.  Abdominal:     General: Bowel sounds are normal.     Palpations: Abdomen is soft. There is no hepatomegaly or splenomegaly.     Tenderness: There is no abdominal tenderness.  Musculoskeletal:     Right wrist: Deformity (contractures) present.     Left wrist: Deformity  (contractures) present.     Right hand: Deformity (contractures) present.     Left hand: Deformity (contractures) present.     Cervical back: Normal range of motion and neck supple.     Right lower leg: No edema.  Left lower leg: No edema.  Lymphadenopathy:     Head:     Right side of head: No submental, submandibular, tonsillar, preauricular or posterior auricular adenopathy.     Left side of head: No submental, submandibular, tonsillar, preauricular or posterior auricular adenopathy.     Cervical: No cervical adenopathy.     Upper Body:     Right upper body: No supraclavicular, axillary or pectoral adenopathy.     Left upper body: No supraclavicular, axillary or pectoral adenopathy.  Skin:    General: Skin is warm and dry.     Capillary Refill: Capillary refill takes less than 2 seconds.     Findings: No rash.  Neurological:     Mental Status: She is alert and oriented to person, place, and time.     Gait: Gait is intact.     Deep Tendon Reflexes: Reflexes are normal and symmetric.     Reflex Scores:      Brachioradialis reflexes are 2+ on the right side and 2+ on the left side.      Patellar reflexes are 2+ on the right side and 2+ on the left side. Psychiatric:        Attention and Perception: Attention normal.        Mood and Affect: Mood normal.        Speech: Speech normal.        Behavior: Behavior normal. Behavior is cooperative.        Thought Content: Thought content normal.        Judgment: Judgment normal.     Results for orders placed or performed in visit on 08/12/21  CBC with Differential/Platelet  Result Value Ref Range   WBC 4.9 3.4 - 10.8 x10E3/uL   RBC 5.24 3.77 - 5.28 x10E6/uL   Hemoglobin 16.1 (H) 11.1 - 15.9 g/dL   Hematocrit 16.1 (H) 09.6 - 46.6 %   MCV 90 79 - 97 fL   MCH 30.7 26.6 - 33.0 pg   MCHC 34.0 31.5 - 35.7 g/dL   RDW 04.5 40.9 - 81.1 %   Platelets 190 150 - 450 x10E3/uL   Neutrophils 58 Not Estab. %   Lymphs 28 Not Estab. %    Monocytes 9 Not Estab. %   Eos 4 Not Estab. %   Basos 1 Not Estab. %   Neutrophils Absolute 2.8 1.4 - 7.0 x10E3/uL   Lymphocytes Absolute 1.4 0.7 - 3.1 x10E3/uL   Monocytes Absolute 0.5 0.1 - 0.9 x10E3/uL   EOS (ABSOLUTE) 0.2 0.0 - 0.4 x10E3/uL   Basophils Absolute 0.0 0.0 - 0.2 x10E3/uL   Immature Granulocytes 0 Not Estab. %   Immature Grans (Abs) 0.0 0.0 - 0.1 x10E3/uL  Comprehensive metabolic panel  Result Value Ref Range   Glucose 83 70 - 99 mg/dL   BUN 12 6 - 20 mg/dL   Creatinine, Ser 9.14 0.57 - 1.00 mg/dL   eGFR 782 >95 AO/ZHY/8.65   BUN/Creatinine Ratio 17 9 - 23   Sodium 140 134 - 144 mmol/L   Potassium 4.6 3.5 - 5.2 mmol/L   Chloride 105 96 - 106 mmol/L   CO2 22 20 - 29 mmol/L   Calcium 9.6 8.7 - 10.2 mg/dL   Total Protein 7.4 6.0 - 8.5 g/dL   Albumin 4.7 3.8 - 4.8 g/dL   Globulin, Total 2.7 1.5 - 4.5 g/dL   Albumin/Globulin Ratio 1.7 1.2 - 2.2   Bilirubin Total 0.3 0.0 - 1.2 mg/dL   Alkaline Phosphatase  130 (H) 44 - 121 IU/L   AST 23 0 - 40 IU/L   ALT 22 0 - 32 IU/L  Lipid panel  Result Value Ref Range   Cholesterol, Total 199 100 - 199 mg/dL   Triglycerides 409 0 - 149 mg/dL   HDL 64 >81 mg/dL   VLDL Cholesterol Cal 25 5 - 40 mg/dL   LDL Chol Calc (NIH) 191 (H) 0 - 99 mg/dL   Chol/HDL Ratio 3.1 0.0 - 4.4 ratio  TSH  Result Value Ref Range   TSH 2.500 0.450 - 4.500 uIU/mL      Assessment & Plan:   Problem List Items Addressed This Visit       Respiratory   Allergic rhinitis   Other Visit Diagnoses     Annual physical exam    -  Primary   Health maintenance reviewed during visit today.  No labs today.        Follow up plan: Return in about 1 year (around 09/18/2023) for Physical and Fasting labs.   LABORATORY TESTING:  - Pap smear: not applicable  IMMUNIZATIONS:   - Tdap: Tetanus vaccination status reviewed: Refused. - Influenza: Postponed to flu season - Pneumovax: Not applicable - Prevnar: Not applicable - COVID: Not applicable -  HPV: Not applicable - Shingrix vaccine: Not applicable  SCREENING: -Mammogram: Not applicable  - Colonoscopy: Not applicable  - Bone Density: Not applicable  -Hearing Test: Not applicable  -Spirometry: Not applicable   PATIENT COUNSELING:   Advised to take 1 mg of folate supplement per day if capable of pregnancy.   Sexuality: Discussed sexually transmitted diseases, partner selection, use of condoms, avoidance of unintended pregnancy  and contraceptive alternatives.   Advised to avoid cigarette smoking.  I discussed with the patient that most people either abstain from alcohol or drink within safe limits (<=14/week and <=4 drinks/occasion for males, <=7/weeks and <= 3 drinks/occasion for females) and that the risk for alcohol disorders and other health effects rises proportionally with the number of drinks per week and how often a drinker exceeds daily limits.  Discussed cessation/primary prevention of drug use and availability of treatment for abuse.   Diet: Encouraged to adjust caloric intake to maintain  or achieve ideal body weight, to reduce intake of dietary saturated fat and total fat, to limit sodium intake by avoiding high sodium foods and not adding table salt, and to maintain adequate dietary potassium and calcium preferably from fresh fruits, vegetables, and low-fat dairy products.    stressed the importance of regular exercise  Injury prevention: Discussed safety Gomez, safety helmets, smoke detector, smoking near bedding or upholstery.   Dental health: Discussed importance of regular tooth brushing, flossing, and dental visits.    NEXT PREVENTATIVE PHYSICAL DUE IN 1 YEAR. Return in about 1 year (around 09/18/2023) for Physical and Fasting labs.

## 2022-12-04 ENCOUNTER — Encounter: Payer: Self-pay | Admitting: Family Medicine

## 2022-12-04 ENCOUNTER — Telehealth: Payer: MEDICAID | Admitting: Family Medicine

## 2022-12-04 DIAGNOSIS — J069 Acute upper respiratory infection, unspecified: Secondary | ICD-10-CM

## 2022-12-04 MED ORDER — PREDNISOLONE 15 MG/5ML PO SOLN: 15.0000 mg | Freq: Every day | ORAL | 0 refills | Status: DC

## 2022-12-04 MED ORDER — PREDNISOLONE 15 MG/5ML PO SOLN: 15.0000 mg | Freq: Every day | ORAL | 0 refills | Status: AC

## 2022-12-04 NOTE — Progress Notes (Signed)
LMP 11/05/2022 (Approximate)    Subjective:    Patient ID: Charlene Gomez, female    DOB: 06-Aug-1990, 33 y.o.   MRN: [id number]  HPI: Charlene Gomez is a 32 y.o. female  Chief Complaint  Patient presents with   URI    Been going on for three days    UPPER RESPIRATORY TRACT INFECTION Duration: 3 days Worst symptom: congestion Fever: yes- 2 days ago Cough: no Shortness of breath: no Wheezing: no Chest pain: no Chest tightness: no Chest congestion: no Nasal congestion: no Runny nose: yes Post nasal drip: yes Sneezing: no Sore throat: yes Swollen glands: no Sinus pressure: no Headache: no Face pain: no Toothache: no Ear pain: yes "right Ear pressure: no  Eyes red/itching:no Eye drainage/crusting: no  Vomiting: no Rash: no Fatigue: no Sick contacts: no Strep contacts: no  Context: stable Recurrent sinusitis: no Relief with OTC cold/cough medications: no  Treatments attempted: allegra   Relevant past medical, surgical, family and social history reviewed and updated as indicated. Interim medical history since our last visit reviewed. Allergies and medications reviewed and updated.  Review of Systems  Constitutional:  Positive for fever. Negative for activity change, appetite change, chills, diaphoresis, fatigue and unexpected weight change.  HENT:  Positive for congestion and ear pain. Negative for dental problem, drooling, ear discharge, facial swelling, hearing loss, mouth sores, nosebleeds, postnasal drip, rhinorrhea, sinus pressure, sinus pain, sneezing, sore throat, tinnitus, trouble swallowing and voice change.   Eyes: Negative.   Respiratory: Negative.    Cardiovascular: Negative.   Gastrointestinal: Negative.   Psychiatric/Behavioral: Negative.      Per HPI unless specifically indicated above     Objective:    LMP 11/05/2022 (Approximate)   Wt Readings from Last 3 Encounters:  02/24/22 79 lb (35.8 kg)  05/23/20 100 lb (45.4 kg)     Physical Exam Vitals and nursing note reviewed.  Constitutional:      General: She is not in acute distress.    Appearance: Normal appearance. She is not ill-appearing, toxic-appearing or diaphoretic.  HENT:     Head: Normocephalic and atraumatic.     Right Ear: External ear normal.     Left Ear: External ear normal.     Nose: Nose normal.     Mouth/Throat:     Mouth: Mucous membranes are moist.     Pharynx: Oropharynx is clear.  Eyes:     General: No scleral icterus.       Right eye: No discharge.        Left eye: No discharge.     Conjunctiva/sclera: Conjunctivae normal.     Pupils: Pupils are equal, round, and reactive to light.  Pulmonary:     Effort: Pulmonary effort is normal. No respiratory distress.     Comments: Speaking in full sentences Musculoskeletal:        General: Normal range of motion.     Cervical back: Normal range of motion.  Skin:    Coloration: Skin is not jaundiced or pale.     Findings: No bruising, erythema, lesion or rash.  Neurological:     Mental Status: She is alert and oriented to person, place, and time. Mental status is at baseline.  Psychiatric:        Mood and Affect: Mood normal.        Behavior: Behavior normal.        Thought Content: Thought content normal.        Judgment: Judgment normal.  Results for orders placed or performed in visit on 08/12/21  CBC with Differential/Platelet  Result Value Ref Range   WBC 4.9 3.4 - 10.8 x10E3/uL   RBC 5.24 3.77 - 5.28 x10E6/uL   Hemoglobin 16.1 (H) 11.1 - 15.9 g/dL   Hematocrit 34.7 (H) 42.5 - 46.6 %   MCV 90 79 - 97 fL   MCH 30.7 26.6 - 33.0 pg   MCHC 34.0 31.5 - 35.7 g/dL   RDW 95.6 38.7 - 56.4 %   Platelets 190 150 - 450 x10E3/uL   Neutrophils 58 Not Estab. %   Lymphs 28 Not Estab. %   Monocytes 9 Not Estab. %   Eos 4 Not Estab. %   Basos 1 Not Estab. %   Neutrophils Absolute 2.8 1.4 - 7.0 x10E3/uL   Lymphocytes Absolute 1.4 0.7 - 3.1 x10E3/uL   Monocytes Absolute 0.5 0.1 -  0.9 x10E3/uL   EOS (ABSOLUTE) 0.2 0.0 - 0.4 x10E3/uL   Basophils Absolute 0.0 0.0 - 0.2 x10E3/uL   Immature Granulocytes 0 Not Estab. %   Immature Grans (Abs) 0.0 0.0 - 0.1 x10E3/uL  Comprehensive metabolic panel  Result Value Ref Range   Glucose 83 70 - 99 mg/dL   BUN 12 6 - 20 mg/dL   Creatinine, Ser 3.32 0.57 - 1.00 mg/dL   eGFR 951 >88 CZ/YSA/6.30   BUN/Creatinine Ratio 17 9 - 23   Sodium 140 134 - 144 mmol/L   Potassium 4.6 3.5 - 5.2 mmol/L   Chloride 105 96 - 106 mmol/L   CO2 22 20 - 29 mmol/L   Calcium 9.6 8.7 - 10.2 mg/dL   Total Protein 7.4 6.0 - 8.5 g/dL   Albumin 4.7 3.8 - 4.8 g/dL   Globulin, Total 2.7 1.5 - 4.5 g/dL   Albumin/Globulin Ratio 1.7 1.2 - 2.2   Bilirubin Total 0.3 0.0 - 1.2 mg/dL   Alkaline Phosphatase 130 (H) 44 - 121 IU/L   AST 23 0 - 40 IU/L   ALT 22 0 - 32 IU/L  Lipid panel  Result Value Ref Range   Cholesterol, Total 199 100 - 199 mg/dL   Triglycerides 160 0 - 149 mg/dL   HDL 64 >10 mg/dL   VLDL Cholesterol Cal 25 5 - 40 mg/dL   LDL Chol Calc (NIH) 932 (H) 0 - 99 mg/dL   Chol/HDL Ratio 3.1 0.0 - 4.4 ratio  TSH  Result Value Ref Range   TSH 2.500 0.450 - 4.500 uIU/mL      Assessment & Plan:   Problem List Items Addressed This Visit   None Visit Diagnoses     Upper respiratory tract infection, unspecified type    -  Primary   Will treat with prednisolone, discussed with Mom if ear pain is worse, bring her in for in person exam. If not significantly better by Monday, consider abx        Follow up plan: Return if symptoms worsen or fail to improve.   This visit was completed via video visit through MyChart due to the restrictions of the COVID-19 pandemic. All issues as above were discussed and addressed. Physical exam was done as above through visual confirmation on video through MyChart. If it was felt that the patient should be evaluated in the office, they were directed there. The patient verbally consented to this visit. Location of  the patient: home Location of the provider: work Those involved with this call:  Provider: Olevia Perches, DO CMA:  Maggie Font, CMA  Front Desk/Registration:  [employer]   Time spent on call:  15 minutes with patient face to face via video conference. More than 50% of this time was spent in counseling and coordination of care. 23 minutes total spent in review of patient's record and preparation of their chart.

## 2022-12-08 ENCOUNTER — Ambulatory Visit: Payer: Self-pay

## 2022-12-08 MED ORDER — AMOXICILLIN 400 MG/5ML PO SUSR: 500.0000 mg | Freq: Two times a day (BID) | ORAL | 0 refills | Status: AC

## 2022-12-08 NOTE — Telephone Encounter (Signed)
Discussed with Mom- she does not remember reaction to medication, but Dorrace has been able to take aumentin and amoxicillin without reaction before. Will start her on amoxicillin

## 2022-12-08 NOTE — Addendum Note (Signed)
Addended by: Dorcas Carrow on: 12/08/2022 04:56 PM   Modules accepted: Orders

## 2022-12-08 NOTE — Telephone Encounter (Signed)
Chief Complaint: Medication question Symptoms: post-nasal drip and restless Frequency: ongoing  Pertinent Negatives: Patient denies cough, SOB, and other symptoms Disposition: [] ED /[] Urgent Care (no appt availability in office) / [] Appointment(In office/virtual)/ []  [facility]/ [] Home Care/ [] Refused Recommended Disposition /[] [facility]/ [x]  Follow-up with PCP Additional Notes: Spoke to patient's mom Lyla Son who is requesting an antibiotic be sent to the pharmacy for patient's symptoms. Lyla Son stated that she was advised to call if the patient's symptoms have not improved since last office visit which was 12/04/22. Lyla Son reports patient is taking prednisolone as directed but thinks an antibiotic is needed at this time. Care advice given and advised that I would forward message to provider for additional recommendations at this time.  Summary: Pt mother requests that a Rx for an antibiotic   Pt mother requests that a Rx for an antibiotic be sent to [id number] - [place], [place] - [address]  Phone: [phone]  Fax: [phone]     Reason for Disposition  Prescription request for new medicine (not a refill)  Answer Assessment - Initial Assessment Questions 1. NAME of MEDICINE: "What medicine(s) are you calling about?"     Antibiotic  2. QUESTION: "What is your question?" (e.g., double dose of medicine, side effect)     Can an antibiotic be sent in to the pharmacy for the patient's symptoms.  3. PRESCRIBER: "Who prescribed the medicine?" Reason: if prescribed by specialist, call should be referred to that group.     Clydie Braun, NP 4. SYMPTOMS: "Do you have any symptoms?" If Yes, ask: "What symptoms are you having?"  "How bad are the symptoms (e.g., mild, moderate, severe)     Post-nasal drip, restless,  Protocols used: Medication Question Call-A-AH

## 2022-12-08 NOTE — Telephone Encounter (Signed)
Please find out what her allergic reaction is so I know what I can send in- Rx pended

## 2022-12-08 NOTE — Telephone Encounter (Signed)
Patient's mom called and she says there is no allergic reaction. She says she's wanting to have her started on an antibiotic due to the symptoms not any better, drainage to the ears, down throat, and has started coughing. She says she usually have to take an antibiotic, augmentin is a good one. Advised I will send this to Dr. Laural Benes.

## 2022-12-08 NOTE — Addendum Note (Signed)
Addended by: Dorcas Carrow on: 12/08/2022 03:36 PM   Modules accepted: Orders

## 2023-02-03 ENCOUNTER — Telehealth: Payer: Self-pay | Admitting: Nurse Practitioner

## 2023-02-03 NOTE — Telephone Encounter (Signed)
Copied from CRM [id number]. Topic: General - Other >> Feb 03, 2023  8:59 AM Turkey B wrote: Reason for CRM: Marchelle Folks from [employer] called in says needs order for a Gtube/mickey size 87fr 2.0 centermeter. Fx [phone], also says if we want to send notes to support size change, we can.

## 2023-02-05 NOTE — Telephone Encounter (Signed)
Okay to write letter 

## 2023-02-05 NOTE — Telephone Encounter (Signed)
Ok to write this order

## 2023-02-05 NOTE — Telephone Encounter (Signed)
Marchelle Folks called again from [employer] asking about the order she called about on the 10/08.  Please advise.

## 2023-02-06 ENCOUNTER — Telehealth: Payer: Self-pay | Admitting: Gastroenterology

## 2023-02-06 NOTE — Telephone Encounter (Signed)
Patient mom Lyla Son) called in to request a feeding tube for her daughter. The mother stated that she is bed bound and she has to have the tube replace every week.

## 2023-02-09 NOTE — Telephone Encounter (Signed)
Order written and placed in providers folder for signature.

## 2023-02-10 NOTE — Telephone Encounter (Signed)
Order signed by provider and faxed in as requested.

## 2023-02-11 NOTE — Telephone Encounter (Signed)
An order had been faxed from the company providing her Gtubes.

## 2023-02-12 NOTE — Telephone Encounter (Signed)
Dr. Tobi Bastos stated that the patient is able to get G-tube replacement every month. Patient requires this more often due to repeated seizures and scoliosis leading to fracture of the balloon due to movement. The order will be fax to [employer].

## 2023-02-13 ENCOUNTER — Telehealth: Payer: Self-pay | Admitting: Nurse Practitioner

## 2023-02-13 NOTE — Telephone Encounter (Signed)
Mom Lyla Son called stated she needs to get patients diapers through [employer] and was advised to cntct his PCP. Please f/u with [employer] at [phone] to give the order over the phone

## 2023-02-15 NOTE — Telephone Encounter (Signed)
Please review paperwork, I believe I have already signed this.

## 2023-02-16 NOTE — Telephone Encounter (Signed)
New requested paperwork was signed and faxed back.

## 2023-04-18 ENCOUNTER — Other Ambulatory Visit: Payer: Self-pay | Admitting: Gastroenterology

## 2023-05-22 ENCOUNTER — Telehealth (INDEPENDENT_AMBULATORY_CARE_PROVIDER_SITE_OTHER): Payer: MEDICAID | Admitting: Physician Assistant

## 2023-05-22 DIAGNOSIS — J069 Acute upper respiratory infection, unspecified: Secondary | ICD-10-CM | POA: Diagnosis not present

## 2023-05-22 DIAGNOSIS — J309 Allergic rhinitis, unspecified: Secondary | ICD-10-CM | POA: Diagnosis not present

## 2023-05-22 DIAGNOSIS — J3089 Other allergic rhinitis: Secondary | ICD-10-CM

## 2023-05-22 MED ORDER — FEXOFENADINE HCL 30 MG/5ML PO SUSP: 30.0000 mg | Freq: Two times a day (BID) | ORAL | 0 refills | Status: DC

## 2023-05-22 MED ORDER — AMOXICILLIN-POT CLAVULANATE 875-125 MG PO TABS: 1.0000 | ORAL_TABLET | Freq: Two times a day (BID) | ORAL | 0 refills | Status: DC

## 2023-05-22 NOTE — Progress Notes (Signed)
Virtual Visit via Video Note  I connected with Charlene Gomez on 05/22/23 at  9:40 AM EST by a video enabled telemedicine application and verified that I am speaking with the correct person using two identifiers.  Today's Provider: Jacquelin Hawking, MHS, PA-C Introduced myself to the patient as a PA-C and provided education on APPs in clinical practice.    Location: Patient: at home in [place], [place]  Provider: [facility], [place], [place]    I discussed the limitations of evaluation and management by telemedicine and the availability of in person appointments. The patient expressed understanding and agreed to proceed.  Chief Complaint  Patient presents with   Sinusitis    X1 week   Nasal Congestion    History of Present Illness:   Discussed the use of AI scribe software for clinical note transcription with the patient, who gave verbal consent to proceed.  History of Present Illness   The patient, a non-verbal, wheelchair-bound individual with a history of seizures, has been experiencing increased nasal drainage for over a week. The drainage is described as being so severe that it affects the taste of her food. The caregiver reports that the patient has had two seizures in the past day, which is unusual for her typical seizure pattern. The caregiver believes these seizures are related to the current illness. The patient has not exhibited any fever, chills, shortness of breath, or coughing. The caregiver requests an antibiotic, believing the patient's symptoms are indicative of a sinus infection. The patient is also due for a refill of her Allegra prescription. The patient's caregiver has expressed concern about over-the-counter congestion medications interacting with the patient's seizure medication, carbamazepine.         Review of Systems  Constitutional:  Negative for chills, fever and malaise/fatigue.  HENT:  Positive for congestion and sinus pain.        Change  in sense of taste    Respiratory:  Negative for cough, sputum production, shortness of breath and wheezing.   Neurological:  Positive for seizures.     Observations/Objective:  Due to the nature of the virtual visit, physical exam and observations are limited. Able to obtain the following observations:   Alert, patient is nonverbal - HPI provided by her mother  Appears comfortable, in no acute distress.  No scleral injection, no appreciated hoarseness, tachypnea, wheeze or strider. No cough appreciated during visit.   Assessment and Plan:  Problem List Items Addressed This Visit       Respiratory   Allergic rhinitis   Relevant Medications   fexofenadine (ALLEGRA) 30 MG/5ML suspension   Other Visit Diagnoses       Upper respiratory tract infection, unspecified type    -  Primary   Relevant Medications   amoxicillin-clavulanate (AUGMENTIN) 875-125 MG tablet      Assessment and Plan    Sinusitis Nasal drainage for over a week, causing discomfort and affecting feeding. No fever, chills, shortness of breath, or cough. Likely sinusitis given the duration and symptoms. Discussed Augmentin and over-the-counter Mucinex for congestion. Advised against combination medications containing acetaminophen due to potential interaction with carbamazepine. - Prescribe Augmentin - Recommend over-the-counter Mucinex for congestion - Advise against combination medications containing acetaminophen  Seizure Disorder Two seizures occurred yesterday. No lingering concerns. Seizures may be exacerbated by current illness. Emphasized continuing current seizure medications and refilling Allegra. - Continue current seizure medications - Refill Allegra        Follow Up Instructions:  I discussed the assessment and treatment plan with the patient. The patient was provided an opportunity to ask questions and all were answered. The patient agreed with the plan and demonstrated an understanding  of the instructions.   The patient was advised to call back or seek an in-person evaluation if the symptoms worsen or if the condition fails to improve as anticipated.  I provided 8 minutes of non-face-to-face time during this encounter.  No follow-ups on file.   I, Ralston Venus E Lilliann Rossetti, PA-C, have reviewed all documentation for this visit. The documentation on 05/22/23 for the exam, diagnosis, procedures, and orders are all accurate and complete.   Jacquelin Hawking, MHS, PA-C [facility]

## 2023-07-16 ENCOUNTER — Telehealth: Payer: Self-pay | Admitting: Nurse Practitioner

## 2023-07-16 NOTE — Telephone Encounter (Signed)
 Copied from CRM #[id number]. Topic: Clinical - Medication Question >> Jul 16, 2023  2:06 PM Abundio Miu S wrote: Reason for NW[facility] representative, Juan Quam, requesting that an order for patient's stomach tube due to scoliosis be sent to [facility]. Callback [phone]

## 2023-07-17 ENCOUNTER — Other Ambulatory Visit: Payer: Self-pay | Admitting: Gastroenterology

## 2023-07-20 ENCOUNTER — Encounter: Payer: Self-pay | Admitting: Nurse Practitioner

## 2023-07-20 ENCOUNTER — Telehealth (INDEPENDENT_AMBULATORY_CARE_PROVIDER_SITE_OTHER): Payer: MEDICAID | Admitting: Nurse Practitioner

## 2023-07-20 DIAGNOSIS — M4146 Neuromuscular scoliosis, lumbar region: Secondary | ICD-10-CM

## 2023-07-20 DIAGNOSIS — Z931 Gastrostomy status: Secondary | ICD-10-CM

## 2023-07-20 DIAGNOSIS — G809 Cerebral palsy, unspecified: Secondary | ICD-10-CM

## 2023-07-20 NOTE — Progress Notes (Signed)
 There were no vitals taken for this visit.   Subjective:    Patient ID: Charlene Gomez, female    DOB: [birth year], 33 y.o.   MRN: [id number]  HPI: Charlene Gomez is a 33 y.o. female  Chief Complaint  Patient presents with   Feeding Intolerance        Patient's mom states that she needs more G tubes.  Due to her anatomy and the constant need to be using her G Tube.  She needs at least 1 per month. Denies other concerns.  Mom states patient is doing well at this time.    Relevant past medical, surgical, family and social history reviewed and updated as indicated. Interim medical history since our last visit reviewed. Allergies and medications reviewed and updated.  Review of Systems  Gastrointestinal:        Needs new G tube    Per HPI unless specifically indicated above     Objective:    There were no vitals taken for this visit.  Wt Readings from Last 3 Encounters:  02/24/22 79 lb (35.8 kg)  05/23/20 100 lb (45.4 kg)    Physical Exam Vitals and nursing note reviewed.  Constitutional:      General: She is not in acute distress.    Appearance: She is not ill-appearing.  HENT:     Head: Normocephalic.     Right Ear: Hearing normal.     Left Ear: Hearing normal.     Nose: Nose normal.  Pulmonary:     Effort: No respiratory distress.  Neurological:     Mental Status: She is alert.     Results for orders placed or performed in visit on 08/12/21  CBC with Differential/Platelet   Collection Time: 08/12/21  9:44 AM  Result Value Ref Range   WBC 4.9 3.4 - 10.8 x10E3/uL   RBC 5.24 3.77 - 5.28 x10E6/uL   Hemoglobin 16.1 (H) 11.1 - 15.9 g/dL   Hematocrit 56.2 (H) 13.0 - 46.6 %   MCV 90 79 - 97 fL   MCH 30.7 26.6 - 33.0 pg   MCHC 34.0 31.5 - 35.7 g/dL   RDW 86.5 78.4 - 69.6 %   Platelets 190 150 - 450 x10E3/uL   Neutrophils 58 Not Estab. %   Lymphs 28 Not Estab. %   Monocytes 9 Not Estab. %   Eos 4 Not Estab. %   Basos 1 Not Estab. %   Neutrophils  Absolute 2.8 1.4 - 7.0 x10E3/uL   Lymphocytes Absolute 1.4 0.7 - 3.1 x10E3/uL   Monocytes Absolute 0.5 0.1 - 0.9 x10E3/uL   EOS (ABSOLUTE) 0.2 0.0 - 0.4 x10E3/uL   Basophils Absolute 0.0 0.0 - 0.2 x10E3/uL   Immature Granulocytes 0 Not Estab. %   Immature Grans (Abs) 0.0 0.0 - 0.1 x10E3/uL  Comprehensive metabolic panel   Collection Time: 08/12/21  9:44 AM  Result Value Ref Range   Glucose 83 70 - 99 mg/dL   BUN 12 6 - 20 mg/dL   Creatinine, Ser 2.95 0.57 - 1.00 mg/dL   eGFR 284 >13 KG/MWN/0.27   BUN/Creatinine Ratio 17 9 - 23   Sodium 140 134 - 144 mmol/L   Potassium 4.6 3.5 - 5.2 mmol/L   Chloride 105 96 - 106 mmol/L   CO2 22 20 - 29 mmol/L   Calcium 9.6 8.7 - 10.2 mg/dL   Total Protein 7.4 6.0 - 8.5 g/dL   Albumin 4.7 3.8 - 4.8 g/dL   Globulin,  Total 2.7 1.5 - 4.5 g/dL   Albumin/Globulin Ratio 1.7 1.2 - 2.2   Bilirubin Total 0.3 0.0 - 1.2 mg/dL   Alkaline Phosphatase 130 (H) 44 - 121 IU/L   AST 23 0 - 40 IU/L   ALT 22 0 - 32 IU/L  Lipid panel   Collection Time: 08/12/21  9:44 AM  Result Value Ref Range   Cholesterol, Total 199 100 - 199 mg/dL   Triglycerides 914 0 - 149 mg/dL   HDL 64 >78 mg/dL   VLDL Cholesterol Cal 25 5 - 40 mg/dL   LDL Chol Calc (NIH) 295 (H) 0 - 99 mg/dL   Chol/HDL Ratio 3.1 0.0 - 4.4 ratio  TSH   Collection Time: 08/12/21  9:44 AM  Result Value Ref Range   TSH 2.500 0.450 - 4.500 uIU/mL      Assessment & Plan:   Problem List Items Addressed This Visit       Nervous and Auditory   CP (cerebral palsy) (HCC) - Primary   Relevant Orders   For home use only DME Other see comment     Musculoskeletal and Integument   Neuromuscular scoliosis of lumbar region   Relevant Orders   For home use only DME Other see comment     Other   Gastrostomy tube dependent (HCC)   Relevant Orders   For home use only DME Other see comment     Follow up plan: No follow-ups on file.   This visit was completed via MyChart due to the restrictions of the  COVID-19 pandemic. All issues as above were discussed and addressed. Physical exam was done as above through visual confirmation on MyChart. If it was felt that the patient should be evaluated in the office, they were directed there. The patient verbally consented to this visit. Location of the patient: Home Location of the provider: Office Those involved with this call:  Provider: Larae Grooms, NP CMA: Oswaldo Conroy, CMA Front Desk/Registration: Servando Snare This encounter was conducted via video.  I spent 20 dedicated to the care of this patient on the date of this encounter to include previsit review of symptoms, plan of care and follow up, face to face time with the patient, and post visit ordering of testing.

## 2023-07-20 NOTE — Telephone Encounter (Signed)
 Scheduled for telehealth virtual appointment to meet the medical necessity of the supplies as required by [facility] Medicaid.   Supplies needed:  -- MIC-KEY* Low-Profile Gastrostomy Feeding Tube Kit - 14 Fr, 2.0 cm - Insertion Stylet ([place] Only) - Water-soluble Lubricant ([place] Only) - Continuous Feed Extension Set, 12 inch - Bolus Feed Extension Set, 12 inch - 6ml Luer Slip Syringe - 35ml Enteral Feeding Syringe - Gauze Pad

## 2023-07-29 ENCOUNTER — Other Ambulatory Visit: Payer: Self-pay | Admitting: Gastroenterology

## 2023-08-17 ENCOUNTER — Encounter: Payer: Self-pay | Admitting: Nurse Practitioner

## 2023-08-17 ENCOUNTER — Telehealth (INDEPENDENT_AMBULATORY_CARE_PROVIDER_SITE_OTHER): Payer: MEDICAID | Admitting: Nurse Practitioner

## 2023-08-17 ENCOUNTER — Ambulatory Visit: Payer: Self-pay

## 2023-08-17 DIAGNOSIS — B9789 Other viral agents as the cause of diseases classified elsewhere: Secondary | ICD-10-CM | POA: Diagnosis not present

## 2023-08-17 DIAGNOSIS — J0110 Acute frontal sinusitis, unspecified: Secondary | ICD-10-CM

## 2023-08-17 DIAGNOSIS — J3089 Other allergic rhinitis: Secondary | ICD-10-CM | POA: Diagnosis not present

## 2023-08-17 MED ORDER — AMOXICILLIN 400 MG/5ML PO SUSR: 500.0000 mg | Freq: Two times a day (BID) | ORAL | 0 refills | Status: AC

## 2023-08-17 MED ORDER — GUAIFENESIN-DM 100-10 MG/5ML PO SYRP: 5.0000 mL | ORAL_SOLUTION | ORAL | 0 refills | Status: DC | PRN

## 2023-08-17 MED ORDER — FEXOFENADINE HCL 30 MG/5ML PO SUSP: 30.0000 mg | Freq: Two times a day (BID) | ORAL | 0 refills | Status: DC

## 2023-08-17 NOTE — Telephone Encounter (Signed)
  Chief Complaint: cough Symptoms: cough  Pertinent Negatives: Patient denies sob Disposition: [] ED /[] Urgent Care (no appt availability in office) / [x] Appointment(In office/virtual)/ []  [facility]/ [] Home Care/ [] Refused Recommended Disposition /[] [facility]/ []  Follow-up with PCP Additional Notes: Mom states that she is having a cough and she thinks patient needs an antibiotic. States pt is unable to tell her symptoms. States cough is keeping pt up at night.  Sates pt is running a low grade fever of 99.7.  Called [facility]  and spoke with Iris and scheduled virtual for 1420 today.   Copied from CRM [id number]. Topic: Clinical - Medical Advice >> Aug 17, 2023  9:42 AM Leory Rands wrote: Reason for CRM: Patients mother is calling to report that the paitent has a dry cough. Patient is handicap. Requesting an appointment today. Advised that the soonest appt is tomorrow. Patients mother stated in the past patient had been worked in. Please advise Reason for Disposition  [1] Continuous (nonstop) coughing interferes with work or school AND [2] no improvement using cough treatment per Care Advice  Answer Assessment - Initial Assessment Questions 1. ONSET: "When did the cough begin?"      X 1 week 5. DIFFICULTY BREATHING: "Are you having difficulty breathing?" If Yes, ask: "How bad is it?" (e.g., mild, moderate, severe)    - MILD: No SOB at rest, mild SOB with walking, speaks normally in sentences, can lie down, no retractions, pulse < 100.    - MODERATE: SOB at rest, SOB with minimal exertion and prefers to sit, cannot lie down flat, speaks in phrases, mild retractions, audible wheezing, pulse 100-120.    - SEVERE: Very SOB at rest, speaks in single words, struggling to breathe, sitting hunched forward, retractions, pulse > 120      no 6. FEVER: "Do you have a fever?" If Yes, ask: "What is your temperature, how was it measured, and when did it start?"     99.7   9. PE RISK FACTORS:  "Do you have a history of blood clots?" (or: recent major surgery, recent prolonged travel, bedridden)     diabled 10. OTHER SYMPTOMS: "Do you have any other symptoms?" (e.g., runny nose, wheezing, chest pain)       unknown  Protocols used: Cough - Acute Non-Productive-A-AH

## 2023-08-17 NOTE — Progress Notes (Signed)
 There were no vitals taken for this visit.   Subjective:    Patient ID: Charlene Gomez, female    DOB: 07/05/1990, 33 y.o.   MRN: [id number]  HPI: Charlene Gomez is a 33 y.o. female  Chief Complaint  Patient presents with   Cough    3-4 days, husband also had it. Dry coughing only.    UPPER RESPIRATORY TRACT INFECTION Worst symptom: symptoms started about 4 days ago.   Fever: no Cough: yes Shortness of breath: no Wheezing: no Nasal congestion: no Runny nose: no Post nasal drip: yes Sneezing: no Fatigue: yes Sick contacts: yes Strep contacts: no  Context: stable Recurrent sinusitis: no   Relevant past medical, surgical, family and social history reviewed and updated as indicated. Interim medical history since our last visit reviewed. Allergies and medications reviewed and updated.  Review of Systems  Constitutional:  Positive for fatigue.  HENT:  Positive for congestion. Negative for sneezing.   Respiratory:  Positive for cough. Negative for shortness of breath and wheezing.     Per HPI unless specifically indicated above     Objective:    There were no vitals taken for this visit.  Wt Readings from Last 3 Encounters:  02/24/22 79 lb (35.8 kg)  05/23/20 100 lb (45.4 kg)    Physical Exam Vitals and nursing note reviewed.  HENT:     Head: Normocephalic.     Right Ear: Hearing normal.     Left Ear: Hearing normal.     Nose: Nose normal.  Eyes:     Pupils: Pupils are equal, round, and reactive to light.  Pulmonary:     Effort: Pulmonary effort is normal. No respiratory distress.  Neurological:     Mental Status: She is alert.  Psychiatric:        Mood and Affect: Mood normal.        Behavior: Behavior normal.        Thought Content: Thought content normal.        Judgment: Judgment normal.     Results for orders placed or performed in visit on 08/12/21  CBC with Differential/Platelet   Collection Time: 08/12/21  9:44 AM  Result Value Ref  Range   WBC 4.9 3.4 - 10.8 x10E3/uL   RBC 5.24 3.77 - 5.28 x10E6/uL   Hemoglobin 16.1 (H) 11.1 - 15.9 g/dL   Hematocrit 78.2 (H) 95.6 - 46.6 %   MCV 90 79 - 97 fL   MCH 30.7 26.6 - 33.0 pg   MCHC 34.0 31.5 - 35.7 g/dL   RDW 21.3 08.6 - 57.8 %   Platelets 190 150 - 450 x10E3/uL   Neutrophils 58 Not Estab. %   Lymphs 28 Not Estab. %   Monocytes 9 Not Estab. %   Eos 4 Not Estab. %   Basos 1 Not Estab. %   Neutrophils Absolute 2.8 1.4 - 7.0 x10E3/uL   Lymphocytes Absolute 1.4 0.7 - 3.1 x10E3/uL   Monocytes Absolute 0.5 0.1 - 0.9 x10E3/uL   EOS (ABSOLUTE) 0.2 0.0 - 0.4 x10E3/uL   Basophils Absolute 0.0 0.0 - 0.2 x10E3/uL   Immature Granulocytes 0 Not Estab. %   Immature Grans (Abs) 0.0 0.0 - 0.1 x10E3/uL  Comprehensive metabolic panel   Collection Time: 08/12/21  9:44 AM  Result Value Ref Range   Glucose 83 70 - 99 mg/dL   BUN 12 6 - 20 mg/dL   Creatinine, Ser 4.69 0.57 - 1.00 mg/dL   eGFR  114 >59 mL/min/1.73   BUN/Creatinine Ratio 17 9 - 23   Sodium 140 134 - 144 mmol/L   Potassium 4.6 3.5 - 5.2 mmol/L   Chloride 105 96 - 106 mmol/L   CO2 22 20 - 29 mmol/L   Calcium  9.6 8.7 - 10.2 mg/dL   Total Protein 7.4 6.0 - 8.5 g/dL   Albumin 4.7 3.8 - 4.8 g/dL   Globulin, Total 2.7 1.5 - 4.5 g/dL   Albumin/Globulin Ratio 1.7 1.2 - 2.2   Bilirubin Total 0.3 0.0 - 1.2 mg/dL   Alkaline Phosphatase 130 (H) 44 - 121 IU/L   AST 23 0 - 40 IU/L   ALT 22 0 - 32 IU/L  Lipid panel   Collection Time: 08/12/21  9:44 AM  Result Value Ref Range   Cholesterol, Total 199 100 - 199 mg/dL   Triglycerides 027 0 - 149 mg/dL   HDL 64 >25 mg/dL   VLDL Cholesterol Cal 25 5 - 40 mg/dL   LDL Chol Calc (NIH) 366 (H) 0 - 99 mg/dL   Chol/HDL Ratio 3.1 0.0 - 4.4 ratio  TSH   Collection Time: 08/12/21  9:44 AM  Result Value Ref Range   TSH 2.500 0.450 - 4.500 uIU/mL      Assessment & Plan:   Problem List Items Addressed This Visit       Respiratory   Allergic rhinitis   Relevant Medications    fexofenadine  (ALLEGRA ) 30 MG/5ML suspension   Other Visit Diagnoses       Acute non-recurrent frontal sinusitis    -  Primary   Will treat with amoxicillin .  Robitussin sent to the pharmacy. Follow up if symptoms not improved.   Relevant Medications   amoxicillin  (AMOXIL ) 400 MG/5ML suspension   guaiFENesin -dextromethorphan (ROBITUSSIN DM) 100-10 MG/5ML syrup   fexofenadine  (ALLEGRA ) 30 MG/5ML suspension        Follow up plan: No follow-ups on file.   This visit was completed via MyChart due to the restrictions of the COVID-19 pandemic. All issues as above were discussed and addressed. Physical exam was done as above through visual confirmation on MyChart. If it was felt that the patient should be evaluated in the office, they were directed there. The patient verbally consented to this visit. Location of the patient: Home Location of the provider: Office Those involved with this call:  Provider: Aileen Alexanders, NP CMA: Althia Jetty, CMA Front Desk/Registration: Jaynee Meyer This encounter was conducted via video.  I spent 20 mins dedicated to the care of this patient on the date of this encounter to include previsit review of symptoms, plan of care and follow up, face to face time with the patient, and post visit ordering of testing.

## 2023-09-25 ENCOUNTER — Encounter: Payer: MEDICAID | Admitting: Nurse Practitioner

## 2023-09-25 NOTE — Progress Notes (Deleted)
 There were no vitals taken for this visit.   Subjective:    Patient ID: Charlene Gomez, female    DOB: [birth year], 33 y.o.   MRN: [id number]  HPI: Charlene Gomez is a 33 y.o. female presenting on 09/25/2023 for comprehensive medical examination. Current medical complaints include:none  She currently lives with: Menopausal Symptoms: no  Depression Screen done today and results listed below:     07/28/2022    1:45 PM  Depression screen PHQ 2/9  Decreased Interest 0  Down, Depressed, Hopeless 0  PHQ - 2 Score 0  Altered sleeping 0  Tired, decreased energy 0  Change in appetite 0  Feeling bad or failure about yourself  0  Trouble concentrating 0  Moving slowly or fidgety/restless 0  Suicidal thoughts 0  PHQ-9 Score 0  Difficult doing work/chores Not difficult at all    The patient does not have a history of falls. I did complete a risk assessment for falls. A plan of care for falls was documented.   Past Medical History:  Past Medical History:  Diagnosis Date  . CP (cerebral palsy) (HCC)   . Developmental delay   . Premature birth   . Scoliosis   . Seizures [facility])     Surgical History:  Past Surgical History:  Procedure Laterality Date  . GASTROSTOMY TUBE CHANGE    . heart murmur repair    . HIP SURGERY Left    2 surgeries, metal rod  . reflux surgery      Medications:  Current Outpatient Medications on File Prior to Visit  Medication Sig  . carbamazepine  (CARBATROL ) 200 MG 12 hr capsule Take by mouth. Take 1 capsule in the AM and 2 capsules in the PM  . diazepam (DIASTAT) 2.5 MG GEL Place 2.5 mg rectally once.   . diazepam (VALIUM) 5 MG tablet Take 5 mg by mouth every 6 (six) hours as needed for anxiety.  . fexofenadine  (ALLEGRA ) 30 MG/5ML suspension Take 5 mLs (30 mg total) by mouth in the morning and at bedtime.  . guaiFENesin -dextromethorphan (ROBITUSSIN DM) 100-10 MG/5ML syrup Take 5 mLs by mouth every 4 (four) hours as needed for cough.  . LINZESS   72 MCG capsule TAKE 1 CAPSULE BY MOUTH DAILY BEFORE BREAKFAST.  . mupirocin  ointment (BACTROBAN ) 2 % Apply 1 Application topically 2 (two) times daily.  . nystatin  cream (MYCOSTATIN ) Apply 1 Application topically 2 (two) times daily.  . omeprazole  (PRILOSEC) 40 MG capsule TAKE 1 CAPSULE (40 MG TOTAL) BY MOUTH IN THE MORNING AND AT BEDTIME.  . traZODone (DESYREL) 50 MG tablet Take 3 tablets by mouth at bedtime.   No current facility-administered medications on file prior to visit.    Allergies:  Allergies  Allergen Reactions  . Cefprozil Other (See Comments)    Social History:  Social History   Socioeconomic History  . Marital status: Single    Spouse name: Not on file  . Number of children: Not on file  . Years of education: Not on file  . Highest education level: Not on file  Occupational History  . Not on file  Tobacco Use  . Smoking status: Never  . Smokeless tobacco: Never  Vaping Use  . Vaping status: Never Used  Substance and Sexual Activity  . Alcohol use: No  . Drug use: No  . Sexual activity: Never  Other Topics Concern  . Not on file  Social History Narrative  . Not on file   Social Drivers  of Health   Financial Resource Strain: Not on file  Food Insecurity: Not on file  Transportation Needs: Not on file  Physical Activity: Not on file  Stress: Not on file  Social Connections: Not on file  Intimate Partner Violence: Not on file   Social History   Tobacco Use  Smoking Status Never  Smokeless Tobacco Never   Social History   Substance and Sexual Activity  Alcohol Use No    Family History:  Family History  Problem Relation Age of Onset  . COPD Mother   . Diabetes Maternal Grandmother   . Heart disease Maternal Grandmother   . Cancer Neg Hx   . Hypertension Neg Hx   . Stroke Neg Hx     Past medical history, surgical history, medications, allergies, family history and social history reviewed with patient today and changes made to  appropriate areas of the chart.   Review of Systems  Reason unable to perform ROS: Patient does not talk.  All other systems reviewed and are negative.  All other ROS negative except what is listed above and in the HPI.      Objective:    There were no vitals taken for this visit.  Wt Readings from Last 3 Encounters:  02/24/22 79 lb (35.8 kg)  05/23/20 100 lb (45.4 kg)    Physical Exam Vitals and nursing note reviewed.  Constitutional:      General: She is awake. She is not in acute distress.    Appearance: Normal appearance. She is underweight. She is not ill-appearing.  HENT:     Head: Normocephalic and atraumatic.     Right Ear: Hearing, tympanic membrane, ear canal and external ear normal. No drainage.     Left Ear: Hearing, tympanic membrane, ear canal and external ear normal. No drainage.     Nose: Nose normal.     Right Sinus: No maxillary sinus tenderness or frontal sinus tenderness.     Left Sinus: No maxillary sinus tenderness or frontal sinus tenderness.     Mouth/Throat:     Mouth: Mucous membranes are moist.     Pharynx: Oropharynx is clear. Uvula midline. No pharyngeal swelling, oropharyngeal exudate or posterior oropharyngeal erythema.  Eyes:     General: Lids are normal.        Right eye: No discharge.        Left eye: No discharge.     Extraocular Movements: Extraocular movements intact.     Conjunctiva/sclera: Conjunctivae normal.     Pupils: Pupils are equal, round, and reactive to light.     Visual Fields: Right eye visual fields normal and left eye visual fields normal.  Neck:     Thyroid: No thyromegaly.     Vascular: No carotid bruit.     Trachea: Trachea normal.  Cardiovascular:     Rate and Rhythm: Normal rate and regular rhythm.     Heart sounds: Normal heart sounds. No murmur heard.    No gallop.  Pulmonary:     Effort: Pulmonary effort is normal. No accessory muscle usage or respiratory distress.     Breath sounds: Wheezing present.   Chest:  Breasts:    Right: Normal.     Left: Normal.  Abdominal:     General: Bowel sounds are normal.     Palpations: Abdomen is soft. There is no hepatomegaly or splenomegaly.     Tenderness: There is no abdominal tenderness.  Musculoskeletal:     Right wrist: Deformity (contractures)  present.     Left wrist: Deformity (contractures) present.     Right hand: Deformity (contractures) present.     Left hand: Deformity (contractures) present.     Cervical back: Normal range of motion and neck supple.     Right lower leg: No edema.     Left lower leg: No edema.  Lymphadenopathy:     Head:     Right side of head: No submental, submandibular, tonsillar, preauricular or posterior auricular adenopathy.     Left side of head: No submental, submandibular, tonsillar, preauricular or posterior auricular adenopathy.     Cervical: No cervical adenopathy.     Upper Body:     Right upper body: No supraclavicular, axillary or pectoral adenopathy.     Left upper body: No supraclavicular, axillary or pectoral adenopathy.  Skin:    General: Skin is warm and dry.     Capillary Refill: Capillary refill takes less than 2 seconds.     Findings: No rash.  Neurological:     Mental Status: She is alert and oriented to person, place, and time.     Gait: Gait is intact.     Deep Tendon Reflexes: Reflexes are normal and symmetric.     Reflex Scores:      Brachioradialis reflexes are 2+ on the right side and 2+ on the left side.      Patellar reflexes are 2+ on the right side and 2+ on the left side. Psychiatric:        Attention and Perception: Attention normal.        Mood and Affect: Mood normal.        Speech: Speech normal.        Behavior: Behavior normal. Behavior is cooperative.        Thought Content: Thought content normal.        Judgment: Judgment normal.    Results for orders placed or performed in visit on 08/12/21  CBC with Differential/Platelet   Collection Time: 08/12/21  9:44 AM   Result Value Ref Range   WBC 4.9 3.4 - 10.8 x10E3/uL   RBC 5.24 3.77 - 5.28 x10E6/uL   Hemoglobin 16.1 (H) 11.1 - 15.9 g/dL   Hematocrit 16.1 (H) 09.6 - 46.6 %   MCV 90 79 - 97 fL   MCH 30.7 26.6 - 33.0 pg   MCHC 34.0 31.5 - 35.7 g/dL   RDW 04.5 40.9 - 81.1 %   Platelets 190 150 - 450 x10E3/uL   Neutrophils 58 Not Estab. %   Lymphs 28 Not Estab. %   Monocytes 9 Not Estab. %   Eos 4 Not Estab. %   Basos 1 Not Estab. %   Neutrophils Absolute 2.8 1.4 - 7.0 x10E3/uL   Lymphocytes Absolute 1.4 0.7 - 3.1 x10E3/uL   Monocytes Absolute 0.5 0.1 - 0.9 x10E3/uL   EOS (ABSOLUTE) 0.2 0.0 - 0.4 x10E3/uL   Basophils Absolute 0.0 0.0 - 0.2 x10E3/uL   Immature Granulocytes 0 Not Estab. %   Immature Grans (Abs) 0.0 0.0 - 0.1 x10E3/uL  Comprehensive metabolic panel   Collection Time: 08/12/21  9:44 AM  Result Value Ref Range   Glucose 83 70 - 99 mg/dL   BUN 12 6 - 20 mg/dL   Creatinine, Ser 9.14 0.57 - 1.00 mg/dL   eGFR 782 >95 AO/ZHY/8.65   BUN/Creatinine Ratio 17 9 - 23   Sodium 140 134 - 144 mmol/L   Potassium 4.6 3.5 - 5.2 mmol/L  Chloride 105 96 - 106 mmol/L   CO2 22 20 - 29 mmol/L   Calcium  9.6 8.7 - 10.2 mg/dL   Total Protein 7.4 6.0 - 8.5 g/dL   Albumin 4.7 3.8 - 4.8 g/dL   Globulin, Total 2.7 1.5 - 4.5 g/dL   Albumin/Globulin Ratio 1.7 1.2 - 2.2   Bilirubin Total 0.3 0.0 - 1.2 mg/dL   Alkaline Phosphatase 130 (H) 44 - 121 IU/L   AST 23 0 - 40 IU/L   ALT 22 0 - 32 IU/L  Lipid panel   Collection Time: 08/12/21  9:44 AM  Result Value Ref Range   Cholesterol, Total 199 100 - 199 mg/dL   Triglycerides 295 0 - 149 mg/dL   HDL 64 >28 mg/dL   VLDL Cholesterol Cal 25 5 - 40 mg/dL   LDL Chol Calc (NIH) 413 (H) 0 - 99 mg/dL   Chol/HDL Ratio 3.1 0.0 - 4.4 ratio  TSH   Collection Time: 08/12/21  9:44 AM  Result Value Ref Range   TSH 2.500 0.450 - 4.500 uIU/mL      Assessment & Plan:   Problem List Items Addressed This Visit   None     Follow up plan: No follow-ups on  file.   LABORATORY TESTING:  - Pap smear: not applicable  IMMUNIZATIONS:   - Tdap: Tetanus vaccination status reviewed: Refused. - Influenza: Postponed to flu season - Pneumovax: Not applicable - Prevnar: Not applicable - COVID: Not applicable - HPV: Not applicable - Shingrix vaccine: Not applicable  SCREENING: -Mammogram: Not applicable  - Colonoscopy: Not applicable  - Bone Density: Not applicable  -Hearing Test: Not applicable  -Spirometry: Not applicable   PATIENT COUNSELING:   Advised to take 1 mg of folate supplement per day if capable of pregnancy.   Sexuality: Discussed sexually transmitted diseases, partner selection, use of condoms, avoidance of unintended pregnancy  and contraceptive alternatives.   Advised to avoid cigarette smoking.  I discussed with the patient that most people either abstain from alcohol or drink within safe limits (<=14/week and <=4 drinks/occasion for males, <=7/weeks and <= 3 drinks/occasion for females) and that the risk for alcohol disorders and other health effects rises proportionally with the number of drinks per week and how often a drinker exceeds daily limits.  Discussed cessation/primary prevention of drug use and availability of treatment for abuse.   Diet: Encouraged to adjust caloric intake to maintain  or achieve ideal body weight, to reduce intake of dietary saturated fat and total fat, to limit sodium intake by avoiding high sodium foods and not adding table salt, and to maintain adequate dietary potassium and calcium  preferably from fresh fruits, vegetables, and low-fat dairy products.    stressed the importance of regular exercise  Injury prevention: Discussed safety belts, safety helmets, smoke detector, smoking near bedding or upholstery.   Dental health: Discussed importance of regular tooth brushing, flossing, and dental visits.    NEXT PREVENTATIVE PHYSICAL DUE IN 1 YEAR. No follow-ups on file.

## 2023-11-19 ENCOUNTER — Telehealth: Payer: Self-pay | Admitting: Nurse Practitioner

## 2023-11-19 NOTE — Telephone Encounter (Signed)
 Copied from CRM [id number]. Topic: Clinical - Order For Equipment >> Nov 19, 2023  3:18 PM Tiffini S wrote: Reason for CRM: Patient mother called for full order to increase the quantity- please sign  date and send back to [employer] asap

## 2023-11-20 NOTE — Telephone Encounter (Signed)
 Paperwork has been received. Will complete and have provider sign when she returns to the office.

## 2023-11-25 ENCOUNTER — Telehealth: Payer: Self-pay

## 2023-11-25 NOTE — Telephone Encounter (Signed)
 Paperwork was signed and faxed this morning. Called and notified patient's mother that this was done.

## 2023-11-25 NOTE — Telephone Encounter (Signed)
 Copied from CRM [id number]. Topic: Clinical - Medical Advice >> Nov 25, 2023 10:02 AM Tonda B wrote: Reason for CRM: patients mother is calling in to get an update on the paperwork sent to [employer] please call pt mother back asap [phone] (M)

## 2023-12-01 ENCOUNTER — Encounter: Payer: Self-pay | Admitting: Nurse Practitioner

## 2023-12-01 ENCOUNTER — Telehealth (INDEPENDENT_AMBULATORY_CARE_PROVIDER_SITE_OTHER): Payer: MEDICAID | Admitting: Nurse Practitioner

## 2023-12-01 DIAGNOSIS — R6889 Other general symptoms and signs: Secondary | ICD-10-CM

## 2023-12-01 MED ORDER — AMOXICILLIN 400 MG/5ML PO SUSR: 400.0000 mg | Freq: Two times a day (BID) | ORAL | 0 refills | Status: AC

## 2023-12-01 MED ORDER — PREDNISOLONE 15 MG/5ML PO SOLN: 5.0000 mg | Freq: Every day | ORAL | 0 refills | Status: AC

## 2023-12-01 NOTE — Progress Notes (Signed)
 There were no vitals taken for this visit.   Subjective:    Patient ID: Charlene Gomez, female    DOB: 10/23/1990, 33 y.o.   MRN: [id number]  HPI: Charlene Gomez is a 33 y.o. female  Chief Complaint  Patient presents with   Sinusitis    Nasal Drainage. Denies cough, runny nose, or fever    UPPER RESPIRATORY TRACT INFECTION Worst symptom: Mom can hear congestion in the the back of her throat.  She isn't hearing and wheezing or seeing any difficulty breathing.  No runny nose.  Difficulty clearing her secretions and not eating well.  Patient is not taking Allegra .  Zyrtec  made her very sleepy.  Relevant past medical, surgical, family and social history reviewed and updated as indicated. Interim medical history since our last visit reviewed. Allergies and medications reviewed and updated.  Review of Systems  HENT:  Positive for congestion.   Respiratory:  Negative for cough.     Per HPI unless specifically indicated above     Objective:    There were no vitals taken for this visit.  Wt Readings from Last 3 Encounters:  02/24/22 79 lb (35.8 kg)  05/23/20 100 lb (45.4 kg)    Physical Exam Vitals and nursing note reviewed.  HENT:     Head: Normocephalic.     Right Ear: Hearing normal.     Left Ear: Hearing normal.     Nose: Nose normal.  Eyes:     Pupils: Pupils are equal, round, and reactive to light.  Pulmonary:     Effort: Pulmonary effort is normal. No respiratory distress.  Neurological:     Mental Status: She is alert.  Psychiatric:        Mood and Affect: Mood normal.        Behavior: Behavior normal.        Thought Content: Thought content normal.        Judgment: Judgment normal.     Results for orders placed or performed in visit on 08/12/21  CBC with Differential/Platelet   Collection Time: 08/12/21  9:44 AM  Result Value Ref Range   WBC 4.9 3.4 - 10.8 x10E3/uL   RBC 5.24 3.77 - 5.28 x10E6/uL   Hemoglobin 16.1 (H) 11.1 - 15.9 g/dL    Hematocrit 52.6 (H) 34.0 - 46.6 %   MCV 90 79 - 97 fL   MCH 30.7 26.6 - 33.0 pg   MCHC 34.0 31.5 - 35.7 g/dL   RDW 87.9 88.2 - 84.5 %   Platelets 190 150 - 450 x10E3/uL   Neutrophils 58 Not Estab. %   Lymphs 28 Not Estab. %   Monocytes 9 Not Estab. %   Eos 4 Not Estab. %   Basos 1 Not Estab. %   Neutrophils Absolute 2.8 1.4 - 7.0 x10E3/uL   Lymphocytes Absolute 1.4 0.7 - 3.1 x10E3/uL   Monocytes Absolute 0.5 0.1 - 0.9 x10E3/uL   EOS (ABSOLUTE) 0.2 0.0 - 0.4 x10E3/uL   Basophils Absolute 0.0 0.0 - 0.2 x10E3/uL   Immature Granulocytes 0 Not Estab. %   Immature Grans (Abs) 0.0 0.0 - 0.1 x10E3/uL  Comprehensive metabolic panel   Collection Time: 08/12/21  9:44 AM  Result Value Ref Range   Glucose 83 70 - 99 mg/dL   BUN 12 6 - 20 mg/dL   Creatinine, Ser 9.27 0.57 - 1.00 mg/dL   eGFR 885 >40 fO/fpw/8.26   BUN/Creatinine Ratio 17 9 - 23   Sodium 140  134 - 144 mmol/L   Potassium 4.6 3.5 - 5.2 mmol/L   Chloride 105 96 - 106 mmol/L   CO2 22 20 - 29 mmol/L   Calcium  9.6 8.7 - 10.2 mg/dL   Total Protein 7.4 6.0 - 8.5 g/dL   Albumin 4.7 3.8 - 4.8 g/dL   Globulin, Total 2.7 1.5 - 4.5 g/dL   Albumin/Globulin Ratio 1.7 1.2 - 2.2   Bilirubin Total 0.3 0.0 - 1.2 mg/dL   Alkaline Phosphatase 130 (H) 44 - 121 IU/L   AST 23 0 - 40 IU/L   ALT 22 0 - 32 IU/L  Lipid panel   Collection Time: 08/12/21  9:44 AM  Result Value Ref Range   Cholesterol, Total 199 100 - 199 mg/dL   Triglycerides 858 0 - 149 mg/dL   HDL 64 >60 mg/dL   VLDL Cholesterol Cal 25 5 - 40 mg/dL   LDL Chol Calc (NIH) 889 (H) 0 - 99 mg/dL   Chol/HDL Ratio 3.1 0.0 - 4.4 ratio  TSH   Collection Time: 08/12/21  9:44 AM  Result Value Ref Range   TSH 2.500 0.450 - 4.500 uIU/mL      Assessment & Plan:   Problem List Items Addressed This Visit   None Visit Diagnoses       Congestion of throat    -  Primary   Will treat with amoxicillin  and prednisolone .  Recommend allegra  to help with symptoms.  Follow up if not  improved.        Follow up plan: No follow-ups on file.  This visit was completed via MyChart due to the restrictions of the COVID-19 pandemic. All issues as above were discussed and addressed. Physical exam was done as above through visual confirmation on MyChart. If it was felt that the patient should be evaluated in the office, they were directed there. The patient verbally consented to this visit. Location of the patient: Home Location of the provider: Office Those involved with this call:  Provider: Darice Petty, NP CMA: Gareth Fogo, CMA Front Desk/Registration: Claretta Maiden This encounter was conducted via video.  I spent 20 minutes dedicated to the care of this patient on the date of this encounter to include previsit review of plan of care, follow up and symptoms, face to face time with the patient, and post visit ordering of testing.

## 2024-02-29 ENCOUNTER — Telehealth: Payer: Self-pay

## 2024-02-29 NOTE — Telephone Encounter (Signed)
 Paperwork received for patient from [employer] regarding patient's feeding supplies. Office notes from within the last 6 months have to be sent back with this in order for insurance to pay. Please reach out to patient's mother and see if a visit can be scheduled. Can be virtual if able.

## 2024-03-03 NOTE — Telephone Encounter (Signed)
 Scheduled

## 2024-03-09 ENCOUNTER — Telehealth: Payer: Self-pay | Admitting: Nurse Practitioner

## 2024-03-09 MED ORDER — INFLUENZA VAC SPLIT QUAD 0.5 ML IM SUSY: 0.5000 mL | PREFILLED_SYRINGE | Freq: Once | INTRAMUSCULAR | 0 refills | Status: AC

## 2024-03-09 NOTE — Telephone Encounter (Signed)
 Called pharmacy and gave verbal for vaccine from Darice.

## 2024-03-09 NOTE — Telephone Encounter (Signed)
 Prescription sent to the pharmacy.

## 2024-03-09 NOTE — Telephone Encounter (Signed)
 Need Rx for flu shot  to be sent to [facility]. Please advise

## 2024-03-09 NOTE — Telephone Encounter (Signed)
 Copied from CRM [id number]. Topic: Clinical - Prescription Issue >> Mar 09, 2024 12:39 PM Zy'onna H wrote: Reason for CRM: Patient Grandmother called in stating that the patients Rx is not ready yet. [facility] - advised her that they have to yet receive an order for the [employer], per this:   Rx order for Flu shot was sent to [facility] by Melvin Pao, NP @ 12 pm - 03/09/2024  Please Advise.

## 2024-03-14 ENCOUNTER — Encounter: Payer: Self-pay | Admitting: Nurse Practitioner

## 2024-03-14 ENCOUNTER — Telehealth (INDEPENDENT_AMBULATORY_CARE_PROVIDER_SITE_OTHER): Payer: MEDICAID | Admitting: Nurse Practitioner

## 2024-03-14 DIAGNOSIS — Z931 Gastrostomy status: Secondary | ICD-10-CM | POA: Diagnosis not present

## 2024-03-14 DIAGNOSIS — J3089 Other allergic rhinitis: Secondary | ICD-10-CM | POA: Diagnosis not present

## 2024-03-14 DIAGNOSIS — G809 Cerebral palsy, unspecified: Secondary | ICD-10-CM | POA: Diagnosis not present

## 2024-03-14 MED ORDER — FEXOFENADINE HCL 30 MG/5ML PO SUSP: 30.0000 mg | Freq: Two times a day (BID) | ORAL | 1 refills | Status: AC

## 2024-03-14 NOTE — Progress Notes (Signed)
 LMP 03/07/2024 (Approximate)    Subjective:    Patient ID: Charlene Gomez, female    DOB: 1990-08-21, 33 y.o.   MRN: [id number]  HPI: Charlene Gomez is a 33 y.o. female  Chief Complaint  Patient presents with   Follow-up    Mother helped patient with visit   Patient's mom states that she is tolerating her ensure well.  Patient also eats by mouth.  Mom purees her her food and feeds her by mouth.  She is tolerating everything well.  She hasn't been weighed recently.  She is not having any issues with bowel movements.  She does not use the Linzess  everyday.      Relevant past medical, surgical, family and social history reviewed and updated as indicated. Interim medical history since our last visit reviewed. Allergies and medications reviewed and updated.  Review of Systems  Gastrointestinal:        Needs new G tube  All other systems reviewed and are negative.   Per HPI unless specifically indicated above     Objective:    LMP 03/07/2024 (Approximate)   Wt Readings from Last 3 Encounters:  02/24/22 79 lb (35.8 kg)  05/23/20 100 lb (45.4 kg)    Physical Exam Vitals and nursing note reviewed.  Constitutional:      General: She is not in acute distress.    Appearance: She is not ill-appearing.  HENT:     Head: Normocephalic.     Right Ear: Hearing normal.     Left Ear: Hearing normal.     Nose: Nose normal.  Pulmonary:     Effort: No respiratory distress.  Neurological:     Mental Status: She is alert.     Results for orders placed or performed in visit on 08/12/21  CBC with Differential/Platelet   Collection Time: 08/12/21  9:44 AM  Result Value Ref Range   WBC 4.9 3.4 - 10.8 x10E3/uL   RBC 5.24 3.77 - 5.28 x10E6/uL   Hemoglobin 16.1 (H) 11.1 - 15.9 g/dL   Hematocrit 52.6 (H) 65.9 - 46.6 %   MCV 90 79 - 97 fL   MCH 30.7 26.6 - 33.0 pg   MCHC 34.0 31.5 - 35.7 g/dL   RDW 87.9 88.2 - 84.5 %   Platelets 190 150 - 450 x10E3/uL   Neutrophils 58 Not  Estab. %   Lymphs 28 Not Estab. %   Monocytes 9 Not Estab. %   Eos 4 Not Estab. %   Basos 1 Not Estab. %   Neutrophils Absolute 2.8 1.4 - 7.0 x10E3/uL   Lymphocytes Absolute 1.4 0.7 - 3.1 x10E3/uL   Monocytes Absolute 0.5 0.1 - 0.9 x10E3/uL   EOS (ABSOLUTE) 0.2 0.0 - 0.4 x10E3/uL   Basophils Absolute 0.0 0.0 - 0.2 x10E3/uL   Immature Granulocytes 0 Not Estab. %   Immature Grans (Abs) 0.0 0.0 - 0.1 x10E3/uL  Comprehensive metabolic panel   Collection Time: 08/12/21  9:44 AM  Result Value Ref Range   Glucose 83 70 - 99 mg/dL   BUN 12 6 - 20 mg/dL   Creatinine, Ser 9.27 0.57 - 1.00 mg/dL   eGFR 885 >40 fO/fpw/8.26   BUN/Creatinine Ratio 17 9 - 23   Sodium 140 134 - 144 mmol/L   Potassium 4.6 3.5 - 5.2 mmol/L   Chloride 105 96 - 106 mmol/L   CO2 22 20 - 29 mmol/L   Calcium  9.6 8.7 - 10.2 mg/dL   Total Protein 7.4  6.0 - 8.5 g/dL   Albumin 4.7 3.8 - 4.8 g/dL   Globulin, Total 2.7 1.5 - 4.5 g/dL   Albumin/Globulin Ratio 1.7 1.2 - 2.2   Bilirubin Total 0.3 0.0 - 1.2 mg/dL   Alkaline Phosphatase 130 (H) 44 - 121 IU/L   AST 23 0 - 40 IU/L   ALT 22 0 - 32 IU/L  Lipid panel   Collection Time: 08/12/21  9:44 AM  Result Value Ref Range   Cholesterol, Total 199 100 - 199 mg/dL   Triglycerides 858 0 - 149 mg/dL   HDL 64 >60 mg/dL   VLDL Cholesterol Cal 25 5 - 40 mg/dL   LDL Chol Calc (NIH) 889 (H) 0 - 99 mg/dL   Chol/HDL Ratio 3.1 0.0 - 4.4 ratio  TSH   Collection Time: 08/12/21  9:44 AM  Result Value Ref Range   TSH 2.500 0.450 - 4.500 uIU/mL      Assessment & Plan:   Problem List Items Addressed This Visit       Respiratory   Allergic rhinitis   Relevant Medications   fexofenadine  (ALLEGRA ) 30 MG/5ML suspension     Nervous and Auditory   CP (cerebral palsy) (HCC) - Primary   Doing well with no concerns.         Other   Gastrostomy tube dependent (HCC)   Chronic.  Doing well.  Uses ensure.  Also eats purees by mouth. Mom will get a weight for patient soon.  No  concerns at this time.         Follow up plan: No follow-ups on file.   This visit was completed via MyChart due to the restrictions of the COVID-19 pandemic. All issues as above were discussed and addressed. Physical exam was done as above through visual confirmation on MyChart. If it was felt that the patient should be evaluated in the office, they were directed there. The patient verbally consented to this visit. Location of the patient: Home Location of the provider: Office Those involved with this call:  Provider: Darice Petty, NP CMA: Joya Louder, CMA Front Desk/Registration: Claretta Maiden This encounter was conducted via video.  I spent 20 dedicated to the care of this patient on the date of this encounter to include previsit review of symptoms, plan of care and follow up, face to face time with the patient, and post visit ordering of testing.

## 2024-03-14 NOTE — Assessment & Plan Note (Signed)
 Chronic.  Doing well.  Uses ensure.  Also eats purees by mouth. Mom will get a weight for patient soon.  No concerns at this time.

## 2024-03-14 NOTE — Assessment & Plan Note (Signed)
Doing well with no concerns.

## 2024-04-11 ENCOUNTER — Telehealth: Payer: Self-pay | Admitting: Nurse Practitioner

## 2024-04-11 ENCOUNTER — Telehealth: Payer: Self-pay

## 2024-04-11 DIAGNOSIS — Z931 Gastrostomy status: Secondary | ICD-10-CM

## 2024-04-11 NOTE — Telephone Encounter (Unsigned)
 Copied from CRM #[id number]. Topic: Referral - Request for Referral >> Apr 11, 2024 11:16 AM Berwyn MATSU wrote: Did the patient discuss referral with their provider in the last year? Yes (If No - schedule appointment) (If Yes - send message)  Appointment offered? Yes  Type of order/referral and detailed reason for visit:  GI Dr. THERISA ; [facility]   Preference of office, provider, location: [phone]  If referral order, have you been seen by this specialty before? Yes; same issue needs a referral to go back  (If Yes, this issue or another issue? When? Where?  Can we respond through MyChart? Yes

## 2024-04-11 NOTE — Telephone Encounter (Unsigned)
 Copied from CRM [id number]. Topic: Clinical - Prescription Issue >> Apr 11, 2024 10:59 AM Lonell PEDLAR wrote: Reason for CRM: Patient's mother, Elenor, called stating that they are needing a new rx for button tube, patient gets her supplies through [facility]. [facility] contact: [phone]  Mother states that they experience issues with obtaining supplies  monthly and would like a solution to this reoccurring issue.  Pt c/b: [phone]

## 2024-04-11 NOTE — Telephone Encounter (Signed)
 Referral placed.

## 2024-04-11 NOTE — Telephone Encounter (Signed)
 Can this referral be placed for the patient?

## 2024-04-12 NOTE — Telephone Encounter (Signed)
 Script signed by provider and faxed to [facility].  Called and LVM for patient's mother letting her know that the order has been faxed to [facility] for them.

## 2024-04-12 NOTE — Telephone Encounter (Signed)
 Called and LVM letting patient's mother know that the referral has been placed as requested.

## 2024-04-12 NOTE — Telephone Encounter (Signed)
 Contacted [facility] to see what the routine is for ordering this supply for the patient. Representative I spoke with stated that we just need to fax over a prescription for the button tube to [phone].   Will write script and then give to provider to sign.

## 2024-04-18 ENCOUNTER — Ambulatory Visit: Payer: Self-pay

## 2024-04-18 ENCOUNTER — Telehealth: Payer: MEDICAID | Admitting: Family Medicine

## 2024-04-18 DIAGNOSIS — G809 Cerebral palsy, unspecified: Secondary | ICD-10-CM

## 2024-04-18 DIAGNOSIS — B9689 Other specified bacterial agents as the cause of diseases classified elsewhere: Secondary | ICD-10-CM | POA: Diagnosis not present

## 2024-04-18 DIAGNOSIS — J019 Acute sinusitis, unspecified: Secondary | ICD-10-CM | POA: Diagnosis not present

## 2024-04-18 MED ORDER — PREDNISOLONE 15 MG/5ML PO SOLN: 30.0000 mg | Freq: Every day | ORAL | 0 refills | Status: AC

## 2024-04-18 MED ORDER — AMOXICILLIN 400 MG/5ML PO SUSR: 400.0000 mg | Freq: Two times a day (BID) | ORAL | 0 refills | Status: AC

## 2024-04-18 NOTE — Progress Notes (Signed)
 " Virtual Visit Consent   Charlene Gomez, you are scheduled for a virtual visit with a [facility] provider today. Just as with appointments in the office, your consent must be obtained to participate. Your consent will be active for this visit and any virtual visit you may have with one of our providers in the next 365 days. If you have a MyChart account, a copy of this consent can be sent to you electronically.  As this is a virtual visit, video technology does not allow for your provider to perform a traditional examination. This may limit your provider's ability to fully assess your condition. If your provider identifies any concerns that need to be evaluated in person or the need to arrange testing (such as labs, EKG, etc.), we will make arrangements to do so. Although advances in technology are sophisticated, we cannot ensure that it will always work on either your end or our end. If the connection with a video visit is poor, the visit may have to be switched to a telephone visit. With either a video or telephone visit, we are not always able to ensure that we have a secure connection.  By engaging in this virtual visit, you consent to the provision of healthcare and authorize for your insurance to be billed (if applicable) for the services provided during this visit. Depending on your insurance coverage, you may receive a charge related to this service.  I need to obtain your verbal consent now. Are you willing to proceed with your visit today? Charlene Gomez has provided verbal consent on 04/18/2024 for a virtual visit (video or telephone). Charlene Lamp, FNP  Date: 04/18/2024 5:48 PM   Virtual Visit via Video Note   I, Charlene Gomez, connected with  Charlene Gomez  ([id number], [birth year]) on 04/18/2024 at  5:45 PM EST by a video-enabled telemedicine application and verified that I am speaking with the correct person using two identifiers.  Location: Patient: Virtual Visit Location  Patient: Home Provider: Virtual Visit Location Provider: Home Office   I discussed the limitations of evaluation and management by telemedicine and the availability of in person appointments. The patient expressed understanding and agreed to proceed.    History of Present Illness: Charlene Gomez is a 33 y.o. who identifies as a female who was assigned female at birth, and is being seen today for sinus pressure and pain. Her mother is on the line with her. She is in a wheelchair with CP and will smile not talk. She has yellow sinus drainage. Mother says she has had sinus infections and this is the way she acts. Last treatment with amoxil  and prednisone worked well. 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  Resting comfortably  at home.  Head is normocephalic, atraumatic.  No labored breathing.  Speech is clear and coherent with logical content.  Patient is alert and oriented at baseline.    Assessment and Plan: 1. Acute bacterial sinusitis (Primary)  Increase fluids, uc if sx persist or worsen.   Follow Up Instructions: I discussed the assessment and  treatment plan with the patient. The patient was provided an opportunity to ask questions and all were answered. The patient agreed with the plan and demonstrated an understanding of the instructions.  A copy of instructions were sent to the patient via MyChart unless otherwise noted below.   Patient has requested to receive PHI (AVS, Work Notes, etc) pertaining to this video visit  through e-mail as they are currently without active MyChart. They have voiced understand that email is not considered secure and their health information could be viewed by someone other than the patient.   The patient was advised to call back or seek an in-person evaluation if the symptoms worsen or if the condition fails to improve as anticipated.    Charlene Detzel, FNP      [1]  Allergies Allergen Reactions   Cefprozil Other (See Comments)  [2]  Current Outpatient Medications:    amoxicillin  (AMOXIL ) 400 MG/5ML suspension, Place 5 mLs (400 mg total) into feeding tube 2 (two) times daily for 10 days., Disp: 100 mL, Rfl: 0   prednisoLONE  (PRELONE ) 15 MG/5ML SOLN, Place 10 mLs (30 mg total) into feeding tube daily before breakfast for 5 days., Disp: 50 mL, Rfl: 0   carbamazepine  (CARBATROL ) 200 MG 12 hr capsule, Take by mouth. Take 1 capsule in the AM and 2 capsules in the PM, Disp: , Rfl:    diazepam (DIASTAT) 2.5 MG GEL, Place 2.5 mg rectally once. , Disp: , Rfl:    diazepam (VALIUM) 5 MG tablet, Take 5 mg by mouth every 6 (six) hours as needed for anxiety., Disp: , Rfl:    fexofenadine  (ALLEGRA ) 30 MG/5ML suspension, Take 5 mLs (30 mg total) by mouth in the morning and at bedtime., Disp: 300 mL, Rfl: 1   LINZESS  72 MCG capsule, TAKE 1 CAPSULE BY MOUTH DAILY BEFORE BREAKFAST., Disp: 30 capsule, Rfl: 0   mupirocin  ointment (BACTROBAN ) 2 %, Apply 1 Application topically 2 (two) times daily., Disp: 22 g, Rfl: 1   nystatin  cream (MYCOSTATIN ), Apply 1 Application topically 2 (two) times daily., Disp: 30 g, Rfl: 0   omeprazole  (PRILOSEC) 40 MG capsule, TAKE 1 CAPSULE (40 MG TOTAL) BY MOUTH IN THE MORNING AND AT BEDTIME., Disp: 180 capsule, Rfl: 0   traZODone (DESYREL) 50 MG tablet, Take 3 tablets by mouth at bedtime., Disp: , Rfl:   "

## 2024-04-18 NOTE — Patient Instructions (Signed)

## 2024-04-18 NOTE — Telephone Encounter (Signed)
 FYI Only or Action Required?: FYI only for provider: appointment scheduled on 04/18/2024.  Patient was last seen in primary care on 03/14/2024 by Melvin Pao, NP.  Called Nurse Triage reporting Sinusitis.  Symptoms began several weeks ago.  Interventions attempted: Nothing.  Symptoms are: stable.  Triage Disposition: See PCP When Office is Open (Within 3 Days)  Patient/caregiver understands and will follow disposition?: Yes  Copied from CRM #[id number]. Topic: Clinical - Medical Advice >> Apr 18, 2024  9:13 AM Victoria B wrote: Patient/'s mother called , sates patient has sinus infection, again, getting wore, draining in patients throat. Reason for Disposition  [1] Nasal discharge AND [2] present > 10 days  Answer Assessment - Initial Assessment Questions Patient in wheelchair, mother said sshe can't survive if I take her out. Virtual visit booked. Sinus drainage is only symptom. 1. ONSET: When did the nasal discharge start?      2 weeks ago 2. AMOUNT: How much discharge is there?      unsure 3. COUGH: Do you have a cough? If Yes, ask: Describe the color of your mucus. (e.g., clear, white, yellow, green)     denies 4. RESPIRATORY DISTRESS: Describe your breathing.      Denies breathing difficulty. 5. FEVER: Do you have a fever? If Yes, ask: What is your temperature, how was it measured, and when did it start?     Denies 7. OTHER SYMPTOMS: Do you have any other symptoms? (e.g., earache, mouth sores, sore throat, wheezing)     Denies 8. PREGNANCY: Is there any chance you are pregnant? When was your last menstrual period?     Denies  Protocols used: Common Cold-A-AH

## 2024-05-26 ENCOUNTER — Encounter: Payer: Self-pay | Admitting: Nurse Practitioner

## 2024-05-26 ENCOUNTER — Telehealth: Payer: MEDICAID | Admitting: Nurse Practitioner

## 2024-05-26 DIAGNOSIS — R051 Acute cough: Secondary | ICD-10-CM

## 2024-05-26 MED ORDER — PROMETHAZINE-DM 6.25-15 MG/5ML PO SYRP: 5.0000 mL | ORAL_SOLUTION | Freq: Four times a day (QID) | ORAL | 0 refills | Status: AC | PRN

## 2024-05-26 MED ORDER — AMOXICILLIN 400 MG/5ML PO SUSR: 400.0000 mg | Freq: Two times a day (BID) | ORAL | 0 refills | Status: AC

## 2024-05-26 NOTE — Progress Notes (Signed)
 "  LMP 03/28/2024 (Approximate)    Subjective:    Patient ID: Charlene Gomez, female    DOB: Jun 11, 1990, 34 y.o.   MRN: [id number]  HPI: Charlene Gomez is a 34 y.o. female  Chief Complaint  Patient presents with   Cough   Nasal Congestion    Spoke with patient's mother and she was tested for covid and flu (negative for both)   UPPER RESPIRATORY TRACT INFECTION Worst symptom:Symptoms started about 4-5 days ago.  Flu and COVID test are neg.  Mom states patient looks comfortable.  No increased work of breathing. Can't get rid of the cough and congestion. Fever: no Cough: yes Shortness of breath: no Wheezing: no Chest pain: no Chest tightness: no Chest congestion: no Nasal congestion: yes Runny nose: yes Post nasal drip: yes Sneezing: no   Relevant past medical, surgical, family and social history reviewed and updated as indicated. Interim medical history since our last visit reviewed. Allergies and medications reviewed and updated.  Review of Systems  HENT:  Positive for congestion, postnasal drip, rhinorrhea and sneezing.   Respiratory:  Positive for cough. Negative for chest tightness, shortness of breath and wheezing.     Per HPI unless specifically indicated above     Objective:    LMP 03/28/2024 (Approximate)   Wt Readings from Last 3 Encounters:  02/24/22 79 lb (35.8 kg)  05/23/20 100 lb (45.4 kg)    Physical Exam Vitals and nursing note reviewed.  Constitutional:      General: She is not in acute distress.    Appearance: She is not ill-appearing.  HENT:     Head: Normocephalic.     Right Ear: Hearing normal.     Left Ear: Hearing normal.     Nose: Nose normal.  Pulmonary:     Effort: Pulmonary effort is normal. No respiratory distress.  Neurological:     Mental Status: She is alert.  Psychiatric:        Mood and Affect: Mood normal.        Behavior: Behavior normal.        Thought Content: Thought content normal.        Judgment: Judgment  normal.     Results for orders placed or performed in visit on 08/12/21  CBC with Differential/Platelet   Collection Time: 08/12/21  9:44 AM  Result Value Ref Range   WBC 4.9 3.4 - 10.8 x10E3/uL   RBC 5.24 3.77 - 5.28 x10E6/uL   Hemoglobin 16.1 (H) 11.1 - 15.9 g/dL   Hematocrit 52.6 (H) 65.9 - 46.6 %   MCV 90 79 - 97 fL   MCH 30.7 26.6 - 33.0 pg   MCHC 34.0 31.5 - 35.7 g/dL   RDW 87.9 88.2 - 84.5 %   Platelets 190 150 - 450 x10E3/uL   Neutrophils 58 Not Estab. %   Lymphs 28 Not Estab. %   Monocytes 9 Not Estab. %   Eos 4 Not Estab. %   Basos 1 Not Estab. %   Neutrophils Absolute 2.8 1.4 - 7.0 x10E3/uL   Lymphocytes Absolute 1.4 0.7 - 3.1 x10E3/uL   Monocytes Absolute 0.5 0.1 - 0.9 x10E3/uL   EOS (ABSOLUTE) 0.2 0.0 - 0.4 x10E3/uL   Basophils Absolute 0.0 0.0 - 0.2 x10E3/uL   Immature Granulocytes 0 Not Estab. %   Immature Grans (Abs) 0.0 0.0 - 0.1 x10E3/uL  Comprehensive metabolic panel   Collection Time: 08/12/21  9:44 AM  Result Value Ref Range  Glucose 83 70 - 99 mg/dL   BUN 12 6 - 20 mg/dL   Creatinine, Ser 9.27 0.57 - 1.00 mg/dL   eGFR 885 >40 fO/fpw/8.26   BUN/Creatinine Ratio 17 9 - 23   Sodium 140 134 - 144 mmol/L   Potassium 4.6 3.5 - 5.2 mmol/L   Chloride 105 96 - 106 mmol/L   CO2 22 20 - 29 mmol/L   Calcium  9.6 8.7 - 10.2 mg/dL   Total Protein 7.4 6.0 - 8.5 g/dL   Albumin 4.7 3.8 - 4.8 g/dL   Globulin, Total 2.7 1.5 - 4.5 g/dL   Albumin/Globulin Ratio 1.7 1.2 - 2.2   Bilirubin Total 0.3 0.0 - 1.2 mg/dL   Alkaline Phosphatase 130 (H) 44 - 121 IU/L   AST 23 0 - 40 IU/L   ALT 22 0 - 32 IU/L  Lipid panel   Collection Time: 08/12/21  9:44 AM  Result Value Ref Range   Cholesterol, Total 199 100 - 199 mg/dL   Triglycerides 858 0 - 149 mg/dL   HDL 64 >60 mg/dL   VLDL Cholesterol Cal 25 5 - 40 mg/dL   LDL Chol Calc (NIH) 889 (H) 0 - 99 mg/dL   Chol/HDL Ratio 3.1 0.0 - 4.4 ratio  TSH   Collection Time: 08/12/21  9:44 AM  Result Value Ref Range   TSH  2.500 0.450 - 4.500 uIU/mL      Assessment & Plan:   Problem List Items Addressed This Visit   None Visit Diagnoses       Acute cough    -  Primary   Will treat with Augmentin  and cough medicine.  Complete course of antibiotics. Discussed signs and symptoms to monitor for and when to see higher level of care.        Follow up plan: No follow-ups on file.  This visit was completed via MyChart due to the restrictions of the COVID-19 pandemic. All issues as above were discussed and addressed. Physical exam was done as above through visual confirmation on MyChart. If it was felt that the patient should be evaluated in the office, they were directed there. The patient verbally consented to this visit. Location of the patient: Home Location of the provider: Office Those involved with this call:  Provider: Darice Petty, NP CMA: Joya Louder, CMA Front Desk/Registration: Claretta Maiden This encounter was conducted via video.  I spent 20 minutes dedicated to the care of this patient on the date of this encounter to include previsit review of plan of care, medications and follow up, face to face time with the patient, and post visit ordering of testing.       "
